# Patient Record
Sex: Female | Born: 2000 | Race: Black or African American | Hispanic: No | Marital: Single | Smoking: Never smoker
Health system: Southern US, Community
[De-identification: ages and names within clinical notes are randomized; demographics above are authoritative.]

## PROBLEM LIST (undated history)

## (undated) DIAGNOSIS — L309 Dermatitis, unspecified: Secondary | ICD-10-CM

## (undated) DIAGNOSIS — G7000 Myasthenia gravis without (acute) exacerbation: Secondary | ICD-10-CM

---
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
## 2020-01-06 ENCOUNTER — Ambulatory Visit (INDEPENDENT_AMBULATORY_CARE_PROVIDER_SITE_OTHER): Payer: 59 | Admitting: Pediatrics

## 2020-01-06 ENCOUNTER — Encounter (INDEPENDENT_AMBULATORY_CARE_PROVIDER_SITE_OTHER): Payer: Self-pay | Admitting: Pediatrics

## 2020-01-06 DIAGNOSIS — G7000 Myasthenia gravis without (acute) exacerbation: Secondary | ICD-10-CM

## 2020-01-06 DIAGNOSIS — Z72821 Inadequate sleep hygiene: Secondary | ICD-10-CM | POA: Diagnosis not present

## 2020-01-06 NOTE — Patient Instructions (Addendum)
I am glad that you are doing well.  At present I see no reason to make changes in your treatment.  I like the opportunity to see you when your semester is over in May.  Please get in touch with me sooner if you need any help.

## 2020-01-06 NOTE — Progress Notes (Signed)
This is a Pediatric Specialist E-Visit follow up consult provided via WebEx Autumn Hubbard and her mother Autumn Hubbard consented to an E-Visit consult today.  Location of patient: Autumn Hubbard is at home Location of provider: Sherron Flemings is at [facility] on [facility] Patient was referred by Autumn Body, MD   The following participants were involved in this E-Visit: Doristine Hubbard, Dr. 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 female who was evaluated January 06, 2020 for the first time since June 24, 2019.  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  She was seen by Dr. Dani Gobble at [facility] virtually September 21, 2019.  It was noted that she was diagnosed in 2018 though she had mild symptoms in 2016-2017.  She had ocular bulbar and mild generalized weakness.  She was seropositive acetylcholine receptor antibody she responded incompletely to oral Mestinon.  Chest CT scan was unremarkable but she had a thymectomy in July 2019.  This was associated with plasma exchange.  She was on a tapering dose of steroids and unfortunately developed intractable ptosis.  Azathioprine was added and she has been maintained on 5 mg of prednisone every other day without side effects and with a stable examination.  For the most part she does not sense fluctuations during the day although she remembers one time recently at Thanksgiving she had some difficulty swallowing  meat and liquid.  This lasted for about an hour.  She forgotten to take her Mestinon.  She has poor sleep hygiene.  She goes to bed between 3 and 4 in the morning and sleeps until 10 AM to 12 noon.  She takes her school classes at [employer].  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  She thinks that her weight is stable.  Her general health is good.  She has not contracted Covid.  The family had questions about whether or not she should receive the Covid vaccine.  Review of Systems: A complete review of systems was unremarkable except as noted above and below.  Past Medical History History reviewed. No pertinent past medical history. Hospitalizations: Yes.  , Head Injury: No., Nervous System Infections: No., Immunizations up to date: Yes.    Copied from prior chart Symptoms have been present since April or May 2018.  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 The blocking antibody was nondetectable.  Mestinon 30 mg 3 times daily, which significantly improved her strength. This was increased to 30 mg 4 times daily.  Single fiber EMG studies that provided electrodiagnostic evidence of a moderate defect of neuromuscular transmission, which is consistent with myasthenia gravis.  MRI scan of her chestshows a normal size thymus in the anterior mediastinum. She has had evaluation for thyroid antibodies, which were negative.  A decision was made to treat her with plasma exchange followed by thoracoscopic surgery  with resection of her thymus. This went well and she was placed on prednisone for quite some time, which was gradually tapered and discontinuedin December, 2019 or January, 2020.  [facility]  included binding antibody of greater than 80 nmol/L with normal being greater than 0.4. Blocking antibody 37%, positive greater than 30%, modulating antibody greater than 64%, positive greater than 25%. 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  Birth History 8lbs. 0oz. infant born at [redacted]weeks gestational age to a 19year old g 3p 1 0 1 57female. Gestation wasuncomplicated Mother receivedEpidural anesthesia Operative vaginal deliverywith vacuum extraction Nursery Course wascomplicated byNuchal cord and knot in umbilical cord Growth and Development wasrecalled asnormal  Behavior History none  Surgical History History reviewed. No pertinent surgical history.  Family History family history is not on file. Family history is negative for migraines, seizures, intellectual disabilities, blindness, deafness, birth defects, chromosomal disorder, or autism.  Social History Socioeconomic History  . Marital status: Single  . Years of education:  38  . Highest education level:  Quarry manager  Occupational History  . Not employed  Tobacco Use  . Smoking status: Never Smoker  . Smokeless tobacco: Never Used  Substance and Sexual Activity  . Alcohol use: Not on file  . Drug use: Not on file  . 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    She attended [employer].    She lives with both parents when school is out but on campus during the semester. She has one brother.    She enjoys video games, sleeping, and hanging with friends.       She attends [employer] majoring in bioengineering   No Known Allergies  Physical Exam There were no vitals taken for this visit.  General: alert, well developed, well nourished, in no acute distress, black hair, brown eyes, right handed Head: normocephalic, no dysmorphic features Neck: supple, full range of  motion Musculoskeletal: no skeletal deformities or apparent scoliosis Skin: no rashes or neurocutaneous lesions  Neurologic Exam  Mental Status: alert; oriented to person, place and year; knowledge is normal for age; language is normal Cranial Nerves: visual fields are full to double simultaneous stimuli; extraocular movements are full and conjugate; pupils are round reactive to light; funduscopic examination shows sharp disc margins with normal vessels; cannot fully bury her eyelashes, slight severe when she smiles; midline tongue and uvula; hearing appears normal bilaterally Motor: normal functional strength, tone and mass; good fine motor movements; no pronator drift Coordination: good finger-to-nose, rapid repetitive alternating movements and finger apposition Gait and Station: normal gait and station: patient is able to walk on heels, toes and tandem without difficulty; balance is adequate; Romberg exam is negative; Gower response is negative  Assessment 1.  Myasthenia gravis, G70.00. 2.  Poor sleep hygiene, Z72.821.  Discussion I am pleased that Ernest remains neurologically stable.  I explained to her and her mother that she has to have treatment both of her immune system with prednisone and azathioprine as well as the use of Mestinon to treat her disorder of neuromuscular transmission.  Omeprazole as needed to protect her stomach from steroid induced gastritis.  She also takes iron presumably for iron deficiency anemia.  I am concerned that she has such poor sleep hygiene and that she is not getting more exercise.  She feels that she is stable.  I know that she knows a difference when she is having an exacerbation.  Plan No change will be made in her current treatment.  I will refill her medications when requested.  She will return to see me at the end of her school year hopefully for an evaluation in office.  Greater than 50% of a 25-minute visit was spent in counseling and  coordination of care concerning her neuromuscular disorder and its treatment.   Medication List   Accurate as of January 06, 2020 11:59 PM. If you have any questions, ask your nurse or doctor.    azaTHIOprine 50 MG tablet Commonly known as: IMURAN 1+1/2 TABLETS (75MG  TOTAL) DAILY   ferrous sulfate 325 (65 FE) MG tablet Take 325 mg by mouth 2 (two) times daily with a meal.   omeprazole 20 MG capsule Commonly known as: PRILOSEC TAKE 1 CAPSULE BY MOUTH EVERY DAY   predniSONE 5 MG tablet every other day Commonly known as: DELTASONE Take as directed   pyridostigmine 60 MG tablet Commonly known as: MESTINON TAKE 3/4 TABLET 4 TIMES DAILY    The medication list was reviewed and reconciled. All changes or newly prescribed medications were explained.  A complete medication list was provided to the patient/caregiver.  MD

## 2020-02-27 ENCOUNTER — Other Ambulatory Visit (INDEPENDENT_AMBULATORY_CARE_PROVIDER_SITE_OTHER): Payer: Self-pay | Admitting: Pediatrics

## 2020-02-27 DIAGNOSIS — G7001 Myasthenia gravis with (acute) exacerbation: Secondary | ICD-10-CM

## 2020-05-16 ENCOUNTER — Other Ambulatory Visit (INDEPENDENT_AMBULATORY_CARE_PROVIDER_SITE_OTHER): Payer: Self-pay | Admitting: Pediatrics

## 2020-05-16 DIAGNOSIS — K2960 Other gastritis without bleeding: Secondary | ICD-10-CM

## 2020-05-17 ENCOUNTER — Encounter (INDEPENDENT_AMBULATORY_CARE_PROVIDER_SITE_OTHER): Payer: Self-pay | Admitting: Pediatrics

## 2020-05-17 ENCOUNTER — Other Ambulatory Visit: Payer: Self-pay

## 2020-05-17 ENCOUNTER — Ambulatory Visit (INDEPENDENT_AMBULATORY_CARE_PROVIDER_SITE_OTHER): Payer: 59 | Admitting: Pediatrics

## 2020-05-17 VITALS — BP 104/78 | HR 70 | Ht 65.35 in | Wt 121.6 lb

## 2020-05-17 DIAGNOSIS — Z72821 Inadequate sleep hygiene: Secondary | ICD-10-CM | POA: Diagnosis not present

## 2020-05-17 DIAGNOSIS — G7000 Myasthenia gravis without (acute) exacerbation: Secondary | ICD-10-CM | POA: Diagnosis not present

## 2020-05-17 DIAGNOSIS — T380X5A Adverse effect of glucocorticoids and synthetic analogues, initial encounter: Secondary | ICD-10-CM | POA: Diagnosis not present

## 2020-05-17 DIAGNOSIS — K2960 Other gastritis without bleeding: Secondary | ICD-10-CM

## 2020-05-17 MED ORDER — PYRIDOSTIGMINE BROMIDE 60 MG PO TABS: ORAL_TABLET | ORAL | 3 refills | Status: DC

## 2020-05-17 MED ORDER — AZATHIOPRINE 50 MG PO TABS: ORAL_TABLET | ORAL | 3 refills | Status: DC

## 2020-05-17 MED ORDER — PREDNISONE 5 MG PO TABS: ORAL_TABLET | ORAL | 3 refills | Status: DC

## 2020-05-17 MED ORDER — OMEPRAZOLE 20 MG PO CPDR: DELAYED_RELEASE_CAPSULE | ORAL | 3 refills | Status: DC

## 2020-05-17 NOTE — Progress Notes (Signed)
Patient: Autumn Hubbard MRN: [id number] Sex: female DOB: 11/12/2001  Provider: Ellison Carwin, MD Location of Care: [facility] Child Neurology  Note type: Routine return visit  History of Present Illness: Referral Source: Diamantina Monks, MD History from: patient, Autumn Hubbard chart and mom Chief Complaint: Myasthenia Gravis  Autumn Hubbard is a 19 y.o. female who returns May 2021 for the first time since January 06, 2020 for evaluation of generalized myasthenia gravis.  This is a condition that has vacillated in [place], sometimes being in exacerbation but for the most part being in good control.  The work-up associated with her condition is in the past medical history.  Since her last visit, a little over 4 months ago, Autumn Hubbard has been well.  She is not had any significant punctuation and strength.  Her mother mentioned today that they were able to work out together and caring to could keep up with her.  There have been times when mother said that Autumn Hubbard could not even keep up with her when they were walking.  She has not experienced any problems with diplopia, altered vision, eyelid ptosis, trouble swallowing, or axillary weakness.  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  She had a GPA of 3.5 which is very good.  Her general health has been good.  Her sleep hygiene continues to be problematic.  She goes to bed around 3 AM and sleeps until 1 PM.  This is not going to be possible when she returns to school in person this fall.  She has no plans for the summer.  She does not want to work at a job.  Both she and her mother had been vaccinated for Covid.  Review of Systems: A complete review of systems was assessed and was negative..  Past Medical History History reviewed. No pertinent past medical history. Hospitalizations: No., Head Injury: No., Nervous System Infections: No., Immunizations up to date: Yes.    Copied from prior chart Symptoms have  been present since April or May 2018.  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 The blocking antibody was nondetectable.  Mestinon 30 mg 3 times daily, which significantly improved her strength. This was increased to 30 mg 4 times daily.  Single fiber EMG studies that provided electrodiagnostic evidence of a moderate defect of neuromuscular transmission, which is consistent with myasthenia gravis.  MRI scan of her chestshows a normal size thymus in the anterior mediastinum. She has had evaluation for thyroid antibodies, which were negative.  A decision was made to treat her with plasma exchange followed by thoracoscopic surgery with resection of her thymus. This went well and she was placed on prednisone for quite some time, which was gradually tapered and discontinuedin December, 2019 or January, 2020.  [facility] included binding antibody of greater than 80 nmol/L with normal being greater than 0.4. Blocking antibody 37%, positive greater than 30%, modulating antibody greater than 64%, positive greater than 25%. 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  Birth History 8lbs. 0oz. infant born at [redacted]weeks gestational age to a 19year old g 3p 1 0 1 28female. Gestation wasuncomplicated Mother receivedEpidural anesthesia Operative vaginal deliverywith vacuum extraction Nursery Course wascomplicated byNuchal cord and knot in umbilical cord Growth and Development wasrecalled asnormal  Behavior History none  Surgical History History  reviewed. No pertinent surgical history.  Family History family history is not on file. Family history is negative for migraines, seizures, intellectual  disabilities, blindness, deafness, birth defects, chromosomal disorder, or autism.  Social History Socioeconomic History  . Marital status: Single  . Years of education:  84  . Highest education level:  High school graduate  Occupational History  . Not currently employed  Tobacco Use  . Smoking status: Never Smoker  . Smokeless tobacco: Never Used  Substance and Sexual Activity  . Alcohol use: Not on file  . Drug use: Not on file  . 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   She attended [employer].   She lives with both parents. She has one brother.   She enjoys video games, sleeping, and hanging with friends.      She will return to [employer] in the Fall for her sophomore year   No Known Allergies  Physical Exam BP 104/78   Pulse 70   Ht 5' 5.35" (1.66 m)   Wt 121 lb 9.6 oz (55.2 kg)   BMI 20.02 kg/m   General: alert, well developed, well nourished, in no acute distress, black hair, brown eyes, right handed Head: normocephalic, no dysmorphic features Ears, Nose and Throat: Otoscopic: tympanic membranes normal; pharynx: oropharynx is pink without exudates or tonsillar hypertrophy Neck: supple, full range of motion, no cranial or cervical bruits Respiratory: auscultation clear Cardiovascular: no murmurs, pulses are normal Musculoskeletal: no skeletal deformities or apparent scoliosis Skin: no rashes or neurocutaneous lesions  Neurologic Exam  Mental Status: alert; oriented to person, place and year; knowledge is normal for age; language is normal Cranial Nerves: visual fields are full to double simultaneous stimuli; extraocular movements are full and conjugate; pupils are round reactive to light; funduscopic examination shows sharp disc margins with normal vessels; symmetric facial strength; midline tongue and uvula; air conduction is greater than bone conduction bilaterally; no eyelid ptosis no facial weakness  no problems with extraocular movements, or asymmetric smile Motor: Normal strength, tone and mass; good fine motor movements; no pronator drift Sensory: intact responses to cold, vibration, proprioception and stereognosis Coordination: good finger-to-nose, rapid repetitive alternating movements and finger apposition Gait and Station: normal gait and station: patient is able to walk on heels, toes and tandem without difficulty; balance is adequate; Romberg exam is negative; Gower response is negative Reflexes: symmetric and diminished bilaterally; no clonus; bilateral flexor plantar responses  Assessment 1.  Myasthenia gravis, G70.00 2.  Poor sleep hygiene, Z72.821.  Discussion I am pleased that Autumn Hubbard is doing well.  There is no reason to change her medications.  At her request I switched her from the 20 mg prednisone to the 5 so she will not have to cut tablets.  Plan Prescriptions were issued for azathioprine, prednisone 5 mg tablets, and pyridostigmine.  I also added omeprazole.  She will return to see me in 6 months.  I will see her sooner if she has any exacerbation.  I informed her that I would be retiring September 27, 2021.  We will have to find a neuromuscular specialist either in town or at the tertiary care centers to provide long-term care.   Medication List   Accurate as of May 17, 2020 11:59 PM. If you have any questions, ask your nurse or doctor.    azaTHIOprine 50 MG tablet Commonly known as: IMURAN 1+1/2 TABLETS (75MG  TOTAL) DAILY What changed: Another  medication with the same name was removed. Continue taking this medication, and follow the directions you see here. Changed by: [name], MD   ferrous sulfate 325 (65 FE) MG tablet Take 325 mg by mouth 2 (two) times daily with a meal.   omeprazole 20 MG capsule Commonly known as: PRILOSEC TAKE 1 CAPSULE BY MOUTH EVERY DAY What changed:   how much to take  how to take this  when to take  this  additional instructions Changed by: [name], MD   predniSONE 5 MG tablet Commonly known as: DELTASONE Take 1 tablet every other day What changed:   medication strength  additional instructions Changed by: [name], MD   pyridostigmine 60 MG tablet Commonly known as: MESTINON TAKE ONE-HALF TABLET BY  MOUTH 4 TIMES DAILY    The medication list was reviewed and reconciled. All changes or newly prescribed medications were explained.  A complete medication list was provided to the patient/caregiver.  Jodi Geralds MD

## 2020-05-17 NOTE — Patient Instructions (Addendum)
I am very pleased to see you today and happy that you are doing so well both with your myasthenia and with school.  I would like to see you in 6 months.  I will be happy to see you sooner.  As I told you I will retire September 27, 2021 and we will need to find an adult neurologist to provide care.  I do not know if we will be able to do this in [place] or will have to turn to one of the tertiary care institutions in [place].  I see no reason to change your prednisone, pyridostigmine, or azathioprine.  I written for 90-day prescriptions for all 3.  Hopefully these can be converted to [facility] or if your pharmacy will fill them, I would not recommend that.

## 2020-08-07 ENCOUNTER — Other Ambulatory Visit (INDEPENDENT_AMBULATORY_CARE_PROVIDER_SITE_OTHER): Payer: Self-pay | Admitting: Pediatrics

## 2020-08-07 DIAGNOSIS — G7000 Myasthenia gravis without (acute) exacerbation: Secondary | ICD-10-CM

## 2020-08-14 ENCOUNTER — Telehealth (INDEPENDENT_AMBULATORY_CARE_PROVIDER_SITE_OTHER): Payer: Self-pay | Admitting: Pediatrics

## 2020-08-14 DIAGNOSIS — G7000 Myasthenia gravis without (acute) exacerbation: Secondary | ICD-10-CM

## 2020-08-14 MED ORDER — PYRIDOSTIGMINE BROMIDE 60 MG PO TABS: ORAL_TABLET | ORAL | 3 refills | Status: DC

## 2020-08-14 NOTE — Telephone Encounter (Signed)
I confirmed the  Azathioprine and prednisone but was unable to confirm the pyridostigmine.  Once mother confirms that I will give her orders.

## 2020-08-14 NOTE — Addendum Note (Signed)
Addended by: Deetta Perla on: 08/14/2020 05:09 PM   Modules accepted: Orders

## 2020-08-14 NOTE — Telephone Encounter (Signed)
Mom states that she is leaving the country tomorrow. If we are unable to reach her before she leaves she requests that we call patient's cell phone with instructions. Patient's cell phone number is [phone].

## 2020-08-14 NOTE — Telephone Encounter (Signed)
Patient says that she is taking 1 tablet 4 times daily but is only getting 100 tablets a month.  The math does not work out.  I am not certain why the pharmacy has continued to prescribe the prescription "early".  We will increase her to 1-1/4 tablets 4 times daily I told her to be on the look out for upset stomach and blurred vision.  If this happens we will have to increase her prednisone and drop her pyridostigmine back down.  Long-term are going to have to get her adult care probably at [facility] [place].

## 2020-08-14 NOTE — Telephone Encounter (Signed)
Mom called back to report that patient is taking 1 whole tablet of the pyridostigmine instead of 1/2 tablet.

## 2020-08-14 NOTE — Telephone Encounter (Signed)
Who's calling (name and relationship to patient) : Rhodia Albright mom   Best contact number: [phone]  Provider they see: Dr. Sharene Skeans  Reason for call: Mom called stating patient has started to slur her speech. Mom wondered if they should up the mestinon?  Call ID:      PRESCRIPTION REFILL ONLY  Name of prescription:  Pharmacy:

## 2020-09-25 ENCOUNTER — Ambulatory Visit: Payer: 59 | Attending: Internal Medicine

## 2020-09-25 DIAGNOSIS — Z23 Encounter for immunization: Secondary | ICD-10-CM

## 2020-09-25 NOTE — Progress Notes (Signed)
   Covid-19 Vaccination Clinic  Name:  Autumn Hubbard    MRN: [id number] DOB: Feb 26, 2001  09/25/2020  [name] was observed post Covid-19 immunization for 15 minutes without incident. She was provided with Vaccine Information Sheet and instruction to access the V-Safe system.   [name] was instructed to call 911 with any severe reactions post vaccine: Marland Kitchen Difficulty breathing  . Swelling of face and throat  . A fast heartbeat  . A bad rash all over body  . Dizziness and weakness

## 2020-11-19 ENCOUNTER — Ambulatory Visit (INDEPENDENT_AMBULATORY_CARE_PROVIDER_SITE_OTHER): Payer: 59 | Admitting: Pediatrics

## 2020-11-30 ENCOUNTER — Ambulatory Visit (INDEPENDENT_AMBULATORY_CARE_PROVIDER_SITE_OTHER): Payer: 59 | Admitting: Pediatrics

## 2020-12-19 ENCOUNTER — Other Ambulatory Visit: Payer: Self-pay

## 2020-12-19 ENCOUNTER — Ambulatory Visit (INDEPENDENT_AMBULATORY_CARE_PROVIDER_SITE_OTHER): Payer: 59 | Admitting: Pediatrics

## 2020-12-19 ENCOUNTER — Encounter (INDEPENDENT_AMBULATORY_CARE_PROVIDER_SITE_OTHER): Payer: Self-pay | Admitting: Pediatrics

## 2020-12-19 DIAGNOSIS — T380X5A Adverse effect of glucocorticoids and synthetic analogues, initial encounter: Secondary | ICD-10-CM

## 2020-12-19 DIAGNOSIS — K2960 Other gastritis without bleeding: Secondary | ICD-10-CM | POA: Diagnosis not present

## 2020-12-19 DIAGNOSIS — G7000 Myasthenia gravis without (acute) exacerbation: Secondary | ICD-10-CM

## 2020-12-19 LAB — CBC WITH DIFFERENTIAL/PLATELET
Basophils Absolute: 0 10*3/uL (ref 0.0–0.2)
Basos: 1 %
EOS (ABSOLUTE): 0 10*3/uL (ref 0.0–0.4)
Eos: 1 %
Hematocrit: 37.4 % (ref 34.0–46.6)
Hemoglobin: 12.3 g/dL (ref 11.1–15.9)
Immature Grans (Abs): 0 10*3/uL (ref 0.0–0.1)
Immature Granulocytes: 0 %
Lymphocytes Absolute: 1.2 10*3/uL (ref 0.7–3.1)
Lymphs: 31 %
MCH: 33.3 pg — ABNORMAL HIGH (ref 26.6–33.0)
MCHC: 32.9 g/dL (ref 31.5–35.7)
MCV: 101 fL — ABNORMAL HIGH (ref 79–97)
Monocytes Absolute: 0.4 10*3/uL (ref 0.1–0.9)
Monocytes: 9 %
Neutrophils Absolute: 2.3 10*3/uL (ref 1.4–7.0)
Neutrophils: 58 %
Platelets: 306 10*3/uL (ref 150–450)
RBC: 3.69 x10E6/uL — ABNORMAL LOW (ref 3.77–5.28)
RDW: 14 % (ref 11.7–15.4)
WBC: 3.9 10*3/uL (ref 3.4–10.8)

## 2020-12-19 MED ORDER — AZATHIOPRINE 50 MG PO TABS: ORAL_TABLET | ORAL | 2 refills | Status: DC

## 2020-12-19 MED ORDER — PREDNISONE 5 MG PO TABS: ORAL_TABLET | ORAL | 3 refills | Status: DC

## 2020-12-19 MED ORDER — OMEPRAZOLE 20 MG PO CPDR: DELAYED_RELEASE_CAPSULE | ORAL | 3 refills | Status: DC

## 2020-12-19 MED ORDER — PYRIDOSTIGMINE BROMIDE 60 MG PO TABS: ORAL_TABLET | ORAL | 3 refills | Status: DC

## 2020-12-19 NOTE — Progress Notes (Signed)
Patient: Autumn Hubbard MRN: [id number] Sex: female DOB: 2001-01-15  Provider: Ellison Carwin, MD Location of Care: [facility]  Note type: Routine return visit  History of Present Illness: Referral Source: Autumn Monks, MD History from: mother, patient and [facility] chart Chief Complaint: Myasthenia gravis  Autumn Hubbard is a 19 y.o. female who was evaluated December 19, 2020 for the first time since May 17, 2020.  She has generalized myasthenia gravis.  All this is been variable in its presentation, she has been extremely stable over the past 7 months.  Her mother says that every once in a while she thinks that carries his speech is slurred but she is not experiencing diplopia ptosis, or weakness in her limbs.  There is dates back to January 2021.  Is unclear to me why he has become so stable when she had a great deal of fluctuation considerably after her new therapy.  She is a sophomore at [employer] in [employer] [employer].  She is doing very well in school.  There are some nights that she only gets 4 to 6 hours of sleep because of school but somehow she is able to function it does not affect her strength or weakness and she is not falling asleep in class.  She lives in an apartment in [place].  Among her immune medications as azathioprine.  Is been a long time since she had a CBC.  I asked her to have that done within the next 1 to 2 weeks.  She has been immunized for Covid and did well.  I worry however that with the immunosuppressants that she might not have as robust response which would leave her vulnerable to becoming infected.  I told her that she needed to pay close attention to guidelines for the immunosuppressed which might be different from those who were not.  We also discussed transition of care.  I want her seen in the adult myasthenia gravis clinic at [facility].  I do not know who runs it after the retirement of Dr. Dimas Hubbard but I will find  out we will make that referral.  Review of Systems: A complete review of systems was remarkable for patient is here to be seen for myasthenia gravis. She reports that she has been doing well. She states that she has no concerns at this time., all other systems reviewed and negative.  Past Medical History History reviewed. No pertinent past medical history. Hospitalizations: No., Head Injury: No., Nervous System Infections: No., Immunizations up to date: Yes.    Copied from prior chart notes Symptoms have been present since April or May 2018.  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 The blocking antibody was nondetectable.  Mestinon 30 mg 3 times daily, which significantly improved her strength. This was increased to 30 mg 4 times daily.  Single fiber EMG studies that provided electrodiagnostic evidence of a moderate defect of neuromuscular transmission, which is consistent with myasthenia gravis.  MRI scan of her chestshows a normal size thymus in the anterior mediastinum. She has had evaluation for thyroid antibodies, which were negative.  A decision was made to treat her with plasma exchange followed by thoracoscopic surgery with resection of her thymus. This went well and she was placed on prednisone for quite some time, which was gradually tapered and discontinuedin December, 2019 or January, 2020.  [facility] included binding antibody of greater than 80 nmol/L with normal being greater than 0.4. Blocking antibody 37%, positive greater than 30%, modulating antibody greater than 64%, positive greater than 25%. 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  Birth History 8lbs. 0oz. infant born at  [redacted]weeks gestational age to a 19year old g 3p 1 0 1 59female. Gestation wasuncomplicated Mother receivedEpidural anesthesia Operative vaginal deliverywith vacuum extraction Nursery Course wascomplicated byNuchal cord and knot in umbilical cord Growth and Development wasrecalled asnormal  Behavior History none  Surgical History History reviewed.  Thoracostomy for removal of a thymus July 16, 2018.  This was preceded by systemic plasma exchange.  Family History family history is not on file. Family history is negative for migraines, seizures, intellectual disabilities, blindness, deafness, birth defects, chromosomal disorder, or autism.  Social History  Socioeconomic History  . Marital status: Single  . Years of education:  33  . Highest education level:  College sophomore  Occupational History  . Not currently employed  Tobacco Use  . Smoking status: Never Smoker  . Smokeless tobacco: Never Used  Substance and Sexual Activity  . Alcohol use: Not on file  . Drug use: Not on file  . 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    She attended [employer].    She lives with both parents. She has one brother.    She enjoys video games, sleeping, and hanging with friends.    She attends  [employer]. She is a Medical laboratory scientific officer.   No Known Allergies  Physical Exam BP 100/80   Pulse 64   Ht 5' 5.35" (1.66 m)   Wt 121 lb 6.4 oz (55.1 kg)   BMI 19.99 kg/m   General: alert, well developed, well nourished, in no acute distress, black hair, brown eyes, right handed Head: normocephalic, no dysmorphic features Ears, Nose and Throat: Otoscopic: tympanic membranes normal; pharynx: oropharynx is pink without exudates or tonsillar hypertrophy Neck: supple, full range of motion, no cranial or cervical bruits Respiratory: auscultation clear Cardiovascular: no murmurs, pulses are normal Musculoskeletal: no skeletal  deformities or apparent scoliosis Skin: no rashes or neurocutaneous lesions  Neurologic Exam  Mental Status: alert; oriented to person, place and year; knowledge is normal for age; language is normal Cranial Nerves: visual fields are full to double simultaneous stimuli; extraocular movements are full and conjugate; pupils are round reactive to light; funduscopic examination shows sharp disc margins with normal vessels; symmetric facial strength; midline tongue and uvula; air conduction is greater than bone conduction bilaterally Motor: Normal strength, tone and mass; good fine motor movements; no pronator drift Sensory: intact responses to cold, vibration, proprioception and stereognosis Coordination: good finger-to-nose, rapid repetitive alternating movements and finger apposition Gait and Station: normal gait and station: patient is able to walk on heels, toes and tandem without difficulty; balance is adequate; Romberg exam is negative; Gower response is negative Reflexes: symmetric and diminished bilaterally; no clonus; bilateral flexor plantar responses  Assessment 1.  Myasthenia gravis, G70.00 2.  Poor sleep hygiene, Z72.821  Discussion I am pleased that she is neurologically stable.  There is no reason to change her treatments at all.  Plan She will return to see me in 6 months' time.  I hope to have information on the adult myasthenia clinic so that we can affect a smooth transfer of care after my retirement  in September 27, 2021.  I wrote prescriptions for azathioprine omeprazole, prednisone, and pyridostigmine.  I also wrote an order for CBC with differential.   Medication List   Accurate as of December 19, 2020  8:21 AM. If you have any questions, ask your nurse or doctor.        azaTHIOprine 50 MG tablet Commonly known as: IMURAN TAKE 1 AND 1/2 TABLETS BY  MOUTH DAILY   ferrous sulfate 325 (65 FE) MG tablet Take 325 mg by mouth 2 (two) times daily with a meal.    omeprazole 20 MG capsule Commonly known as: PRILOSEC TAKE 1 CAPSULE BY MOUTH EVERY DAY   predniSONE 5 MG tablet Commonly known as: DELTASONE Take 1 tablet every other day   pyridostigmine 60 MG tablet Commonly known as: MESTINON Take 1-1/4 tablets by mouth 4 times daily      The medication list was reviewed and reconciled. All changes or newly prescribed medications were explained.  A complete medication list was provided to the patient/caregiver.  Deetta Perla MD

## 2020-12-19 NOTE — Patient Instructions (Signed)
It was a pleasure to see you today.  I am glad that things are so stable with your myasthenia.  As you know I will retire September 27, 2021.  We need to make certain that we have an exchange of information and I doubt an appointment for you at the myasthenia gravis clinic at [facility].  I will have to figure out what that contact information is to make the referral.  I want to see you again in 6 months.  That will be our last visit.  We need to check CBC with differential every 6 months to make certain that it stable.  I will report back to you whether or not there are any problems.  Please let me know if I can be of assistance between now and then.  Prescriptions were refilled for a year.

## 2020-12-20 ENCOUNTER — Telehealth (INDEPENDENT_AMBULATORY_CARE_PROVIDER_SITE_OTHER): Payer: Self-pay | Admitting: Pediatrics

## 2020-12-20 NOTE — Telephone Encounter (Signed)
I left a message that the blood counts are what would be expected with azathioprine, that they were completely normal but that we did not need to change the medication.  They were stable from previous visits to the current test.

## 2020-12-31 ENCOUNTER — Encounter (INDEPENDENT_AMBULATORY_CARE_PROVIDER_SITE_OTHER): Payer: Self-pay

## 2021-05-02 ENCOUNTER — Encounter (INDEPENDENT_AMBULATORY_CARE_PROVIDER_SITE_OTHER): Payer: Self-pay

## 2021-05-07 ENCOUNTER — Other Ambulatory Visit (INDEPENDENT_AMBULATORY_CARE_PROVIDER_SITE_OTHER): Payer: Self-pay | Admitting: Pediatrics

## 2021-05-07 DIAGNOSIS — G7000 Myasthenia gravis without (acute) exacerbation: Secondary | ICD-10-CM

## 2021-06-26 ENCOUNTER — Ambulatory Visit (INDEPENDENT_AMBULATORY_CARE_PROVIDER_SITE_OTHER): Payer: 59 | Admitting: Pediatrics

## 2021-06-28 ENCOUNTER — Encounter (INDEPENDENT_AMBULATORY_CARE_PROVIDER_SITE_OTHER): Payer: Self-pay | Admitting: Pediatrics

## 2021-06-28 ENCOUNTER — Other Ambulatory Visit: Payer: Self-pay

## 2021-06-28 ENCOUNTER — Ambulatory Visit (INDEPENDENT_AMBULATORY_CARE_PROVIDER_SITE_OTHER): Payer: 59 | Admitting: Pediatrics

## 2021-06-28 VITALS — BP 108/60 | HR 70 | Ht 65.5 in | Wt 122.8 lb

## 2021-06-28 DIAGNOSIS — Z79899 Other long term (current) drug therapy: Secondary | ICD-10-CM | POA: Diagnosis not present

## 2021-06-28 DIAGNOSIS — K2960 Other gastritis without bleeding: Secondary | ICD-10-CM | POA: Diagnosis not present

## 2021-06-28 DIAGNOSIS — T380X5A Adverse effect of glucocorticoids and synthetic analogues, initial encounter: Secondary | ICD-10-CM

## 2021-06-28 DIAGNOSIS — G7000 Myasthenia gravis without (acute) exacerbation: Secondary | ICD-10-CM

## 2021-06-28 MED ORDER — PYRIDOSTIGMINE BROMIDE 60 MG PO TABS: ORAL_TABLET | ORAL | 2 refills | Status: AC

## 2021-06-28 MED ORDER — PREDNISONE 5 MG PO TABS
ORAL_TABLET | ORAL | 3 refills | Status: DC
Start: 2021-06-28 — End: 2023-02-03

## 2021-06-28 MED ORDER — OMEPRAZOLE 20 MG PO CPDR: DELAYED_RELEASE_CAPSULE | ORAL | 3 refills | Status: DC

## 2021-06-28 MED ORDER — AZATHIOPRINE 50 MG PO TABS: ORAL_TABLET | ORAL | 2 refills | Status: DC

## 2021-06-28 NOTE — Progress Notes (Signed)
Patient: Autumn Hubbard MRN: [id number] Sex: female DOB: 20-Jan-2001  Provider: Ellison Carwin, MD Location of Care: [facility]  Note type: Routine return visit  History of Present Illness: Referral Source: Diamantina Monks, MD History from: patient, [facility] chart, and mom Chief Complaint: Myasthenia Gravis  Autumn Hubbard is a 20 y.o. female who was evaluated June 28, 2021 for the first time since December 19, 2020.  She has generalized myasthenia gravis.  She has been very stable since her last visit.  She has some generalized weakness in the morning, and on occasion her mother has noted slurred speech, but she has not experienced diplopia and has not had any significant exacerbations in well over a year.  Her medical history is noted below.  She takes prednisone, azathioprine, pyridostigmine, and omeprazole to protect her stomach.  Her general health is good.  She goes to bed around midnight to 1 AM and gets up around 10 AM.  She sleeps soundly.  She has not contracted COVID.  She has had 2 vaccines and a booster.  There has been some concern about the meningitis B vaccine.  I do not know enough about it to know if her primary physicians reluctance to give it is because of some adverse effect or whether it is just that as an immunosuppressed person, it is not likely to protect her.  Is been a long time since she has had a CBC.  I asked her to get 1 today because of the effects of azathioprine and prednisone.  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  She has a 3.5 grade point average and is having to work hard but is doing well.  In January I went to the website and fill that out so that she could be seen at [facility].  The family has not heard from [facility] and I will take additional steps to make certain that she is transferred to the adult myasthenia gravis clinic well before my retirement of September 27, 2021.  Review of Systems: A  complete review of systems was assessed and was negative.  Past Medical History History reviewed. No pertinent past medical history. Hospitalizations: No., Head Injury: No., Nervous System Infections: No., Immunizations up to date: Yes.    Copied from prior chart notes Symptoms have been present since April or May 2018.    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  The blocking antibody was nondetectable.   Mestinon 30 mg 3 times daily, which significantly improved her strength.  This was increased to 30 mg 4 times daily.   Single fiber EMG studies that provided electrodiagnostic evidence of a moderate defect of neuromuscular transmission, which is consistent with myasthenia gravis.   MRI scan of her chest shows a normal size thymus in the anterior mediastinum.  She has had evaluation for thyroid antibodies, which were negative.   A decision was made to treat her with plasma exchange followed by thoracoscopic surgery with resection of her thymus.  This went well and she was placed on prednisone for quite some time, which was gradually tapered and discontinued in December, 2019 or January, 2020.  She suffered a relapse which led [place] to restart prednisone.  She has had no side effects from it.   Laboratory studies performed at [facility] for acetylcholine receptor antibodies included binding antibody of greater  than 80 nmol/L with normal being greater than 0.4.  Blocking antibody 37%, positive greater than 30%, modulating antibody greater than 64%, positive greater than 25%.  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   Birth History 8 lbs. 0 oz. infant born at [redacted] weeks gestational age to a 20 year old g 3 p 1 0 1 1 female. Gestation was uncomplicated Mother received Epidural anesthesia   Operative vaginal delivery with vacuum extraction Nursery Course was complicated by Nuchal cord and knot in umbilical cord Growth and Development was recalled as  normal   Behavior History none   Surgical History History reviewed.  Thoracostomy for removal of a thymus July 16, 2018.  This was preceded by systemic plasma exchange.  Family History family history is not on file. Family history is negative for migraines, seizures, intellectual disabilities, blindness, deafness, birth defects, chromosomal disorder, or autism.  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   She attended [employer].   She lives with both parents. She has one brother.   She enjoys video games, sleeping, and hanging with friends.      She attends [employer]. She is a rising junior   No Known Allergies  Physical Exam BP 108/60   Pulse 70   Ht 5' 5.5" (1.664 m)   Wt 122 lb 12.8 oz (55.7 kg)   BMI 20.12 kg/m   General: alert, well developed, well nourished, in no acute distress, black hair, brown eyes, right handed Head: normocephalic, no dysmorphic features Ears, Nose and Throat: Otoscopic: tympanic membranes normal; pharynx: oropharynx is pink without exudates or tonsillar hypertrophy Neck: supple, full range of motion, no cranial or cervical bruits Respiratory: auscultation clear Cardiovascular: no murmurs, pulses are normal Musculoskeletal: no skeletal deformities or apparent scoliosis Skin: no rashes or neurocutaneous lesions  Neurologic Exam  Mental Status: alert; oriented to person, place and year; knowledge is  normal for age; language is normal Cranial Nerves: visual fields are full to double simultaneous stimuli; extraocular movements are full and conjugate; pupils are round reactive to light; funduscopic examination shows sharp disc margins with normal vessels; symmetric facial strength; midline tongue and uvula; air conduction is greater than bone conduction bilaterally Motor: normal strength, tone and mass; good fine motor movements; no pronator drift Sensory: intact responses to cold, vibration, proprioception and stereognosis Coordination: good finger-to-nose, rapid repetitive alternating movements and finger apposition Gait and Station: normal gait and station: patient is able to walk on heels, toes and tandem without difficulty; balance is adequate; Romberg exam is negative; Gower response is negative Reflexes: symmetric and diminished bilaterally; no clonus; bilateral flexor plantar responses   Assessment 1.  Myasthenia gravis, G70.00.  Discussion I am pleased that Autumn Hubbard is doing well.  I am concerned that we have not heard from [facility].  Given that we are heading into a long weekend I will address this after that time and in contact with the relevant schedulers for the adult myasthenia gravis clinic and find out what we  have to do.  I want make certain that she has an appointment before I retire so that it does not fall in the family.  I also make sure that she has an appointment before her medications run out 6 months from now.  Plan Refills were made for prednisone, azathioprine, pyridostigmine, and omeprazole.  I also wrote an order for CBC with differential.  She needs to be seen before late December 2022 at the adult myasthenia gravis clinic at [facility].  I told her that our office would help in any way we could between September 30 when I retire and that time.  I told her that if she had a significant exacerbation that she should go to the emergency department where she  would be seen by the adult neuro hospitalists.  Greater than 50% of a 30-minute visit spent in counseling and coordination of care concerning her myasthenia, discussing her treatment, and discussing issues related to transition of care.   Medication List    Accurate as of June 28, 2021  8:22 AM. If you have any questions, ask your nurse or doctor.     azaTHIOprine 50 MG tablet Commonly known as: IMURAN TAKE 1 AND 1/2 TABLETS BY  MOUTH DAILY   ferrous sulfate 325 (65 FE) MG tablet Take 325 mg by mouth 2 (two) times daily with a meal.   omeprazole 20 MG capsule Commonly known as: PRILOSEC TAKE 1 CAPSULE BY MOUTH EVERY DAY   predniSONE 5 MG tablet Commonly known as: DELTASONE Take 1 tablet every other day   pyridostigmine 60 MG tablet Commonly known as: MESTINON TAKE ONE-HALF TABLET BY  MOUTH 4 TIMES DAILY     The medication list was reviewed and reconciled. All changes or newly prescribed medications were explained.  A complete medication list was provided to the patient/caregiver.  Deetta Perla MD

## 2021-06-28 NOTE — Patient Instructions (Addendum)
At [facility], we are committed to providing exceptional care. You will receive a patient satisfaction survey through text or email regarding your visit today. Your opinion is important to me. Comments are appreciated.   It's been a real pleasure to care for you over the past few years.  I will work with you to try to get an appointment at [facility].  I have filled out the website back in January I do not recall exactly what I did.  I will try to speak with the staff and is responsible for scheduling and find out exactly what has to happen.  I will write an order requesting transfer of care and will be happy to send any notes that they want.  Please sign up for My Chart so that she have a way to communicate with me.  I will be available until September 30 which is the day I retire.  I wish you good health and know that you do well and what ever you choose to do.

## 2021-06-29 LAB — CBC WITH DIFFERENTIAL/PLATELET
Basophils Absolute: 0 10*3/uL (ref 0.0–0.2)
Basos: 1 %
EOS (ABSOLUTE): 0 10*3/uL (ref 0.0–0.4)
Eos: 0 %
Hematocrit: 38.1 % (ref 34.0–46.6)
Hemoglobin: 12.9 g/dL (ref 11.1–15.9)
Immature Grans (Abs): 0 10*3/uL (ref 0.0–0.1)
Immature Granulocytes: 0 %
Lymphocytes Absolute: 0.8 10*3/uL (ref 0.7–3.1)
Lymphs: 24 %
MCH: 33.8 pg — ABNORMAL HIGH (ref 26.6–33.0)
MCHC: 33.9 g/dL (ref 31.5–35.7)
MCV: 100 fL — ABNORMAL HIGH (ref 79–97)
Monocytes Absolute: 0.3 10*3/uL (ref 0.1–0.9)
Monocytes: 10 %
Neutrophils Absolute: 2.3 10*3/uL (ref 1.4–7.0)
Neutrophils: 65 %
Platelets: 317 10*3/uL (ref 150–450)
RBC: 3.82 x10E6/uL (ref 3.77–5.28)
RDW: 12.8 % (ref 11.7–15.4)
WBC: 3.5 10*3/uL (ref 3.4–10.8)

## 2021-07-02 ENCOUNTER — Encounter (INDEPENDENT_AMBULATORY_CARE_PROVIDER_SITE_OTHER): Payer: Self-pay

## 2021-09-11 ENCOUNTER — Other Ambulatory Visit (INDEPENDENT_AMBULATORY_CARE_PROVIDER_SITE_OTHER): Payer: Self-pay | Admitting: Pediatrics

## 2021-09-11 DIAGNOSIS — G7000 Myasthenia gravis without (acute) exacerbation: Secondary | ICD-10-CM

## 2022-04-27 ENCOUNTER — Ambulatory Visit
Admission: EM | Admit: 2022-04-27 | Discharge: 2022-04-27 | Disposition: A | Discharge status: Home / Self Care | Payer: 59 | Attending: Urgent Care | Admitting: Urgent Care

## 2022-04-27 ENCOUNTER — Encounter: Payer: Self-pay | Admitting: Emergency Medicine

## 2022-04-27 DIAGNOSIS — J309 Allergic rhinitis, unspecified: Secondary | ICD-10-CM

## 2022-04-27 DIAGNOSIS — G7000 Myasthenia gravis without (acute) exacerbation: Secondary | ICD-10-CM

## 2022-04-27 DIAGNOSIS — J019 Acute sinusitis, unspecified: Secondary | ICD-10-CM

## 2022-04-27 MED ORDER — AMOXICILLIN 875 MG PO TABS: 875.0000 mg | ORAL_TABLET | Freq: Two times a day (BID) | ORAL | 0 refills | Status: DC

## 2022-04-27 NOTE — ED Triage Notes (Signed)
Patient c/o nasal drainage, congestion, mucous in eyes x 3-4 days.  Patient has taken Allegra-D and Flonase for sx's. ?

## 2022-04-27 NOTE — ED Provider Notes (Signed)
?[facility] ? ? ?MRN: [id number] DOB: 21-Nov-2001 ? ?Subjective:  ? ?Autumn Hubbard is a 21 y.o. female presenting for 4 day history of acute onset facial pain, sinus pressure, sinus congestion, coughing. Has been using Allegra-D, Flonase without relief. Has Myasthenia Gravis, is on immunosuppression for this. No chest pain, shob, wheezing. No smoking, history of asthma.  ? ?No current facility-administered medications for this encounter. ? ?Current Outpatient Medications:  ?  azaTHIOprine (IMURAN) 50 MG tablet, TAKE 1 AND 1/2 TABLETS BY  MOUTH DAILY, Disp: 180 tablet, Rfl: 2 ?  ferrous sulfate 325 (65 FE) MG tablet, Take 325 mg by mouth 2 (two) times daily with a meal., Disp: , Rfl:  ?  predniSONE (DELTASONE) 5 MG tablet, Take 1 tablet every other day, Disp: 45 tablet, Rfl: 3 ?  pyridostigmine (MESTINON) 60 MG tablet, TAKE ONE-HALF TABLET BY  MOUTH 4 TIMES DAILY, Disp: 300 tablet, Rfl: 2 ?  omeprazole (PRILOSEC) 20 MG capsule, TAKE 1 CAPSULE BY MOUTH EVERY DAY, Disp: 90 capsule, Rfl: 3  ? ?No Known Allergies ? ?History reviewed. No pertinent past medical history.  ? ?History reviewed. No pertinent surgical history. ? ?History reviewed. No pertinent family history. ? ?Social History  ? ?Tobacco Use  ? Smoking status: Never  ? Smokeless tobacco: Never  ?Vaping Use  ? Vaping Use: Never used  ?Substance Use Topics  ? Alcohol use: Yes  ?  Comment: socially  ? Drug use: Never  ? ? ?ROS ? ? ?Objective:  ? ?Vitals: ?BP 113/76 (BP Location: Right Arm)   Pulse 88   Temp 98.3 ?F (36.8 ?C) (Oral)   Resp 18   Ht 5\' 5"  (1.651 m)   Wt 120 lb (54.4 kg)   SpO2 95%   BMI 19.97 kg/m?  ? ?Physical Exam ?Constitutional:   ?   General: She is not in acute distress. ?   Appearance: Normal appearance. She is well-developed and normal weight. She is not ill-appearing, toxic-appearing or diaphoretic.  ?HENT:  ?   Head: Normocephalic and atraumatic.  ?   Right Ear: Tympanic membrane, ear canal and external ear normal. No  drainage or tenderness. No middle ear effusion. There is no impacted cerumen. Tympanic membrane is not erythematous.  ?   Left Ear: Tympanic membrane, ear canal and external ear normal. No drainage or tenderness.  No middle ear effusion. There is no impacted cerumen. Tympanic membrane is not erythematous.  ?   Nose: Congestion and rhinorrhea present.  ?   Mouth/Throat:  ?   Mouth: Mucous membranes are moist. No oral lesions.  ?   Pharynx: No pharyngeal swelling, oropharyngeal exudate, posterior oropharyngeal erythema or uvula swelling.  ?   Tonsils: No tonsillar exudate or tonsillar abscesses.  ?   Comments: Thick streaks of post-nasal drainage overlying pharynx.  ?Eyes:  ?   General: No scleral icterus.    ?   Right eye: No discharge.     ?   Left eye: No discharge.  ?   Extraocular Movements: Extraocular movements intact.  ?   Right eye: Normal extraocular motion.  ?   Left eye: Normal extraocular motion.  ?   Conjunctiva/sclera: Conjunctivae normal.  ?Cardiovascular:  ?   Rate and Rhythm: Normal rate.  ?   Heart sounds: No murmur heard. ?  No friction rub. No gallop.  ?Pulmonary:  ?   Effort: Pulmonary effort is normal. No respiratory distress.  ?   Breath sounds: No stridor. No wheezing, rhonchi  or rales.  ?Chest:  ?   Chest wall: No tenderness.  ?Musculoskeletal:  ?   Cervical back: Normal range of motion and neck supple.  ?Lymphadenopathy:  ?   Cervical: No cervical adenopathy.  ?Skin: ?   General: Skin is warm and dry.  ?Neurological:  ?   General: No focal deficit present.  ?   Mental Status: She is alert and oriented to person, place, and time.  ?Psychiatric:     ?   Mood and Affect: Mood normal.     ?   Behavior: Behavior normal.  ? ? ?Assessment and Plan :  ? ?PDMP not reviewed this encounter. ? ?1. Acute non-recurrent sinusitis, unspecified location   ?2. Allergic rhinitis, unspecified seasonality, unspecified trigger   ?3. Myasthenia gravis (HCC)   ? ?Patient is on immunosuppression and therefore  susceptible to bacterial infections.  She is using her allergy medications consistently and therefore will supplement her treatment by using amoxicillin for a bacterial sinusitis.  Recommended maintaining her allergy medications except for Flonase. Deferred imaging given clear cardiopulmonary exam, hemodynamically stable vital signs.  I did advise patient that if she has no relief from her symptoms that we may end up needing to use a steroid course to help address her symptoms for sinusitis.  She would have to return to clinic to be reevaluated.  Consider chest x-ray. Counseled patient on potential for adverse effects with medications prescribed/recommended today, ER and return-to-clinic precautions discussed, patient verbalized understanding. ? ?  ?Wallis Bamberg, PA-C ?04/27/22 1514 ? ?

## 2022-08-25 ENCOUNTER — Encounter (HOSPITAL_COMMUNITY): Payer: Self-pay

## 2022-08-25 ENCOUNTER — Ambulatory Visit (HOSPITAL_COMMUNITY)
Admission: EM | Admit: 2022-08-25 | Discharge: 2022-08-25 | Disposition: A | Discharge status: Home / Self Care | Payer: 59

## 2022-08-25 DIAGNOSIS — L03011 Cellulitis of right finger: Secondary | ICD-10-CM | POA: Diagnosis not present

## 2022-08-25 MED ORDER — IBUPROFEN 800 MG PO TABS: 800.0000 mg | ORAL_TABLET | Freq: Once | ORAL | Status: DC

## 2022-08-25 MED ORDER — IBUPROFEN 800 MG PO TABS
ORAL_TABLET | ORAL | Status: AC
  Filled 2022-08-25: qty 1

## 2022-08-25 MED ORDER — IBUPROFEN 600 MG PO TABS: 600.0000 mg | ORAL_TABLET | Freq: Four times a day (QID) | ORAL | 0 refills | Status: DC | PRN

## 2022-08-25 MED ORDER — CEPHALEXIN 500 MG PO CAPS: 500.0000 mg | ORAL_CAPSULE | Freq: Two times a day (BID) | ORAL | 0 refills | Status: AC

## 2022-08-25 NOTE — ED Triage Notes (Signed)
Pt thumb finger on right hand has possible infection due to a hang nail .Thumb finger is red swollen and painful x2days

## 2022-08-25 NOTE — Discharge Instructions (Signed)
Take Keflex twice daily for the next 5 days. Soak your right thumb in salt water 3 times daily. Take ibuprofen 600 mg every 6 hours as needed for pain and swelling to the right thumb. You may have more ibuprofen in 8 hours since you were given some the clinic tonight. Take ibuprofen with food to avoid stomach upset.   Keep your wound clean, dry, and covered.   If you develop any new or worsening symptoms or do not improve in the next 2 to 3 days, please return.  If your symptoms are severe, please go to the emergency room.  Follow-up with your primary care provider for further evaluation and management of your symptoms as well as ongoing wellness visits.  I hope you feel better!

## 2022-08-25 NOTE — ED Provider Notes (Signed)
[facility]    CSN: [id number] Arrival date & time: 08/25/22  1621      History   Chief Complaint Chief Complaint  Patient presents with   Hand Pain    HPI Autumn Hubbard is a 21 y.o. female.   Patient presents urgent care for evaluation of wound to the right thumb after pulling a hangnail a couple of days ago.  Patient states that the skin surrounding the lateral aspect of the right thumbnail is swollen, warm, and red.  She has not attempted use of any over-the-counter medications prior to arrival urgent care.  Denies recent visits to the nail salon for manicure or use of dirty equipment to trim nails.  Pain is currently an 5 on a scale of 0-10 without palpation and this increases to an 8 on a scale of 0-10 with palpation of the infected area.  Nail is intact.     History reviewed. No pertinent past medical history.  Patient Active Problem List   Diagnosis Date Noted   Myasthenia gravis with acute exacerbation (HCC) 04/27/2019   Poor sleep hygiene 04/27/2019   Steroid-induced gastritis 04/27/2019   Myasthenia gravis (HCC) 12/14/2017    History reviewed. No pertinent surgical history.  OB History   No obstetric history on file.      Home Medications    Prior to Admission medications   Medication Sig Start Date End Date Taking? Authorizing Provider  cephALEXin (KEFLEX) 500 MG capsule Take 1 capsule (500 mg total) by mouth 2 (two) times daily for 5 days. 08/25/22 08/30/22 Yes StanhopeDonavan Burnet, FNP  ferrous sulfate 325 (65 FE) MG tablet Take by mouth. 04/04/19  Yes [provider]  ibuprofen (ADVIL) 600 MG tablet Take 1 tablet (600 mg total) by mouth every 6 (six) hours as needed. 08/25/22  Yes Carlisle Beers, FNP  predniSONE (DELTASONE) 5 MG tablet Take by mouth. 09/04/21  Yes [provider]  pyridostigmine (MESTINON) 60 MG tablet Take by mouth. 07/23/22 07/23/23 Yes [provider]  amoxicillin (AMOXIL) 875 MG tablet Take  1 tablet (875 mg total) by mouth 2 (two) times daily. 04/27/22   Wallis Bamberg, PA-C  azaTHIOprine (IMURAN) 50 MG tablet TAKE 1 AND 1/2 TABLETS BY  MOUTH DAILY 06/28/21   Deetta Perla, MD  ferrous sulfate 325 (65 FE) MG tablet Take 325 mg by mouth 2 (two) times daily with a meal. 04/04/19   [provider]  omeprazole (PRILOSEC) 20 MG capsule TAKE 1 CAPSULE BY MOUTH EVERY DAY 06/28/21   Deetta Perla, MD  predniSONE (DELTASONE) 5 MG tablet Take 1 tablet every other day 06/28/21   Deetta Perla, MD  pyridostigmine (MESTINON) 60 MG tablet TAKE ONE-HALF TABLET BY  MOUTH 4 TIMES DAILY 06/28/21   Deetta Perla, MD    Family History History reviewed. No pertinent family history.  Social History Social History   Tobacco Use   Smoking status: Never   Smokeless tobacco: Never  Vaping Use   Vaping Use: Never used  Substance Use Topics   Alcohol use: Yes    Comment: socially   Drug use: Never     Allergies   Patient has no known allergies.   Review of Systems Review of Systems Per HPI  Physical Exam Triage Vital Signs ED Triage Vitals  Enc Vitals Group     BP 08/25/22 1701 121/81     Pulse Rate 08/25/22 1701 77     Resp 08/25/22  1701 12     Temp 08/25/22 1701 98.6 F (37 C)     Temp Source 08/25/22 1701 Oral     SpO2 08/25/22 1701 98 %     Weight 08/25/22 1659 120 lb (54.4 kg)     Height 08/25/22 1659 5\' 5"  (1.651 m)     Head Circumference --      Peak Flow --      Pain Score 08/25/22 1659 6     Pain Loc --      Pain Edu? --      Excl. in GC? --    No data found.  Updated Vital Signs BP 121/81 (BP Location: Left Arm)   Pulse 77   Temp 98.6 F (37 C) (Oral)   Resp 12   Ht 5\' 5"  (1.651 m)   Wt 120 lb (54.4 kg)   LMP 08/18/2022   SpO2 98%   BMI 19.97 kg/m   Visual Acuity Right Eye Distance:   Left Eye Distance:   Bilateral Distance:    Right Eye Near:   Left Eye Near:    Bilateral Near:     Physical Exam Vitals and nursing note  reviewed.  Constitutional:      Appearance: Normal appearance. She is not ill-appearing or toxic-appearing.     Comments: Very pleasant patient sitting on exam in position of comfort table in no acute distress.   HENT:     Head: Normocephalic and atraumatic.     Right Ear: Hearing and external ear normal.     Left Ear: Hearing and external ear normal.     Nose: Nose normal.     Mouth/Throat:     Lips: Pink.     Mouth: Mucous membranes are moist.  Eyes:     General: Lids are normal. Vision grossly intact. Gaze aligned appropriately.     Extraocular Movements: Extraocular movements intact.     Conjunctiva/sclera: Conjunctivae normal.  Pulmonary:     Effort: Pulmonary effort is normal.  Abdominal:     Palpations: Abdomen is soft.  Musculoskeletal:     Cervical back: Neck supple.  Skin:    General: Skin is warm and dry.     Capillary Refill: Capillary refill takes less than 2 seconds.     Findings: No rash.     Comments: Paronychia to the right thumb present that is warm, erythematous, and swollen.  There is an area of pus and fluctuation present.  Normal range of motion of the right thumb.  Capillary refill is less than 3.  +2 radial pulse present to the right wrist.  Neurological:     General: No focal deficit present.     Mental Status: She is alert and oriented to person, place, and time. Mental status is at baseline.     Cranial Nerves: No dysarthria or facial asymmetry.     Gait: Gait is intact.  Psychiatric:        Mood and Affect: Mood normal.        Speech: Speech normal.        Behavior: Behavior normal.        Thought Content: Thought content normal.        Judgment: Judgment normal.       UC Treatments / Results  Labs (all labs ordered are listed, but only abnormal results are displayed) Labs Reviewed - No data to display  EKG   Radiology No results found.  Procedures Incision and Drainage  Date/Time: 08/25/2022  6:15 PM  Performed by: Carlisle Beers, FNP Authorized by: Carlisle Beers, FNP   Consent:    Consent obtained:  Verbal   Consent given by:  Patient   Risks discussed:  Bleeding, incomplete drainage, pain, infection and damage to other organs   Alternatives discussed:  No treatment and delayed treatment Universal protocol:    Patient identity confirmed:  Verbally with patient Location:    Type:  Abscess (Paronychia)   Size:  0.5 cm   Location:  Upper extremity   Upper extremity location:  Finger   Finger location:  R thumb Pre-procedure details:    Skin preparation:  Chlorhexidine with alcohol Sedation:    Sedation type:  None Anesthesia:    Anesthesia method:  Topical application   Topical anesthesia: Pain EEze freezing numbing spray. Procedure details:    Incision types:  Stab incision (18 guauge needle)   Drainage:  Purulent and bloody   Drainage amount:  Scant   Wound treatment:  Wound left open   Packing materials:  None Post-procedure details:    Procedure completion:  Tolerated well, no immediate complications  (including critical care time)  Medications Ordered in UC Medications  ibuprofen (ADVIL) tablet 800 mg (has no administration in time range)    Initial Impression / Assessment and Plan / UC Course  I have reviewed the triage vital signs and the nursing notes.  Pertinent labs & imaging results that were available during my care of the patient were reviewed by me and considered in my medical decision making (see chart for details).   1.  Paronychia of the finger of the right hand Paronychia drained in clinic with 18-gauge needle stab incision using pain ease spray.  See procedure note above for incision and drainage procedure details.  Wound cleansed and dressed in clinic with Band-Aid.  Patient to perform salt water soaks 3 times daily for the next few days to reduce pain and swelling to the right thumb. Keflex antibiotic prescribed to be taken 2 times daily for the next 5 days to  cover for infected material to abscess.  Ibuprofen 600 mg every 6 hours may be used as needed for pain and inflammation.  Patient given 800 mg of ibuprofen in clinic for pain and inflammation.  Advised patient to take this medicine with food to avoid stomach upset.   Discussed physical exam and available lab work findings in clinic with patient.  Counseled patient regarding appropriate use of medications and potential side effects for all medications recommended or prescribed today. Discussed red flag signs and symptoms of worsening condition,when to call the PCP office, return to urgent care, and when to seek higher level of care in the emergency department. Patient verbalizes understanding and agreement with plan. All questions answered. Patient discharged in stable condition.  Final Clinical Impressions(s) / UC Diagnoses   Final diagnoses:  Paronychia of finger of right hand     Discharge Instructions      Take Keflex twice daily for the next 5 days. Soak your right thumb in salt water 3 times daily. Take ibuprofen 600 mg every 6 hours as needed for pain and swelling to the right thumb. You may have more ibuprofen in 8 hours since you were given some the clinic tonight. Take ibuprofen with food to avoid stomach upset.   Keep your wound clean, dry, and covered.   If you develop any new or worsening symptoms or do not improve in the next 2 to 3  days, please return.  If your symptoms are severe, please go to the emergency room.  Follow-up with your primary care provider for further evaluation and management of your symptoms as well as ongoing wellness visits.  I hope you feel better!     ED Prescriptions     Medication Sig Dispense Auth. Provider   cephALEXin (KEFLEX) 500 MG capsule Take 1 capsule (500 mg total) by mouth 2 (two) times daily for 5 days. 10 capsule Carlisle Beers, FNP   ibuprofen (ADVIL) 600 MG tablet Take 1 tablet (600 mg total) by mouth every 6 (six) hours as  needed. 30 tablet Carlisle Beers, FNP      PDMP not reviewed this encounter.   Carlisle Beers, [place] 08/25/22 [phone]

## 2022-12-10 ENCOUNTER — Ambulatory Visit
Admission: RE | Admit: 2022-12-10 | Discharge: 2022-12-10 | Disposition: A | Discharge status: Home / Self Care | Payer: 59 | Source: Ambulatory Visit | Attending: Physician Assistant | Admitting: Physician Assistant

## 2022-12-10 VITALS — BP 97/67 | HR 69 | Temp 98.3°F | Resp 16

## 2022-12-10 DIAGNOSIS — L309 Dermatitis, unspecified: Secondary | ICD-10-CM | POA: Diagnosis not present

## 2022-12-10 HISTORY — DX: Myasthenia gravis without (acute) exacerbation: G70.00

## 2022-12-10 MED ORDER — TRIAMCINOLONE ACETONIDE 0.1 % EX CREA: 1.0000 | TOPICAL_CREAM | Freq: Two times a day (BID) | CUTANEOUS | 0 refills | Status: DC

## 2022-12-10 NOTE — ED Provider Notes (Signed)
[facility]    CSN: [id number] Arrival date & time: 12/10/22  1050      History   Chief Complaint Chief Complaint  Patient presents with   Eczema    HPI Autumn Hubbard is a 21 y.o. female.   Patient here today for evaluation of itchy dry skin patches to her bilateral arms, abdomen, and occasionally on her face.  She states that typically the lesions to her face resolve quickly but she has continued to have some patches to her arms and abdomen.  This is seem to recur over the last several months.  She has tried using an eczema lotion without resolution.  She denies any shortness of breath or trouble swallowing.  The history is provided by the patient.    Past Medical History:  Diagnosis Date   Myasthenia gravis [facility])     Patient Active Problem List   Diagnosis Date Noted   Myasthenia gravis with acute exacerbation (HCC) 04/27/2019   Poor sleep hygiene 04/27/2019   Steroid-induced gastritis 04/27/2019   Myasthenia gravis (HCC) 12/14/2017    History reviewed. No pertinent surgical history.  OB History   No obstetric history on file.      Home Medications    Prior to Admission medications   Medication Sig Start Date End Date Taking? Authorizing Provider  triamcinolone cream (KENALOG) 0.1 % Apply 1 Application topically 2 (two) times daily. 12/10/22  Yes Tomi Bamberger, PA-C  amoxicillin (AMOXIL) 875 MG tablet Take 1 tablet (875 mg total) by mouth 2 (two) times daily. 04/27/22   Wallis Bamberg, PA-C  azaTHIOprine (IMURAN) 50 MG tablet TAKE 1 AND 1/2 TABLETS BY  MOUTH DAILY 06/28/21   Deetta Perla, MD  ferrous sulfate 325 (65 FE) MG tablet Take 325 mg by mouth 2 (two) times daily with a meal. 04/04/19   [provider]  ferrous sulfate 325 (65 FE) MG tablet Take by mouth. 04/04/19   [provider]  ibuprofen (ADVIL) 600 MG tablet Take 1 tablet (600 mg total) by mouth every 6 (six) hours as needed. 08/25/22   Carlisle Beers, FNP   omeprazole (PRILOSEC) 20 MG capsule TAKE 1 CAPSULE BY MOUTH EVERY DAY 06/28/21   Deetta Perla, MD  predniSONE (DELTASONE) 5 MG tablet Take 1 tablet every other day 06/28/21   Deetta Perla, MD  predniSONE (DELTASONE) 5 MG tablet Take by mouth. 09/04/21   [provider]  pyridostigmine (MESTINON) 60 MG tablet TAKE ONE-HALF TABLET BY  MOUTH 4 TIMES DAILY 06/28/21   Deetta Perla, MD  pyridostigmine (MESTINON) 60 MG tablet Take by mouth. 07/23/22 07/23/23  [provider]    Family History History reviewed. No pertinent family history.  Social History Social History   Tobacco Use   Smoking status: Never   Smokeless tobacco: Never  Vaping Use   Vaping Use: Never used  Substance Use Topics   Alcohol use: Yes    Comment: socially   Drug use: Never     Allergies   Patient has no known allergies.   Review of Systems Review of Systems  Constitutional:  Negative for chills and fever.  HENT:  Negative for facial swelling and trouble swallowing.   Eyes:  Negative for discharge and redness.  Respiratory:  Negative for shortness of breath.   Gastrointestinal:  Negative for abdominal pain, nausea and vomiting.  Skin:  Positive for rash.     Physical Exam Triage Vital Signs ED Triage Vitals [12/10/22  1103]  Enc Vitals Group     BP 97/67     Pulse Rate 69     Resp 16     Temp 98.3 F (36.8 C)     Temp Source Oral     SpO2 98 %     Weight      Height      Head Circumference      Peak Flow      Pain Score 0     Pain Loc      Pain Edu?      Excl. in GC?    No data found.  Updated Vital Signs BP 97/67 (BP Location: Left Arm)   Pulse 69   Temp 98.3 F (36.8 C) (Oral)   Resp 16   SpO2 98%      Physical Exam Vitals and nursing note reviewed.  Constitutional:      General: She is not in acute distress.    Appearance: Normal appearance. She is not ill-appearing.  HENT:     Head: Normocephalic and atraumatic.     Nose: No congestion.   Eyes:     Conjunctiva/sclera: Conjunctivae normal.  Cardiovascular:     Rate and Rhythm: Normal rate.  Pulmonary:     Effort: Pulmonary effort is normal.  Skin:    Findings: Rash (dry scaling annular or ellipitcal lesions to arms, abdomen, no central cleearing) present.  Neurological:     Mental Status: She is alert.  Psychiatric:        Mood and Affect: Mood normal.        Behavior: Behavior normal.        Thought Content: Thought content normal.      UC Treatments / Results  Labs (all labs ordered are listed, but only abnormal results are displayed) Labs Reviewed - No data to display  EKG   Radiology No results found.  Procedures Procedures (including critical care time)  Medications Ordered in UC Medications - No data to display  Initial Impression / Assessment and Plan / UC Course  I have reviewed the triage vital signs and the nursing notes.  Pertinent labs & imaging results that were available during my care of the patient were reviewed by me and considered in my medical decision making (see chart for details).    Suspect nummular eczema versus tinea infection given appearance.  Kenalog cream prescribed and recommended topical treatment for 2 weeks.  Advise she continue eczema moisturizer.  Encouraged follow-up with any further concerns or if symptoms do not improve.  Patient expressed understanding.  Final Clinical Impressions(s) / UC Diagnoses   Final diagnoses:  Eczema, unspecified type   Discharge Instructions   None    ED Prescriptions     Medication Sig Dispense Auth. Provider   triamcinolone cream (KENALOG) 0.1 % Apply 1 Application topically 2 (two) times daily. 30 g Tomi Bamberger, PA-C      PDMP not reviewed this encounter.   Tomi Bamberger, PA-C 12/10/22 1218

## 2022-12-10 NOTE — ED Triage Notes (Signed)
Pt c/o patches of dry itchy skin that started ~ sept/oct that is not going away with otc eczema lotions.

## 2023-02-03 ENCOUNTER — Encounter (HOSPITAL_COMMUNITY): Payer: Self-pay

## 2023-02-03 ENCOUNTER — Ambulatory Visit (HOSPITAL_COMMUNITY)
Admission: RE | Admit: 2023-02-03 | Discharge: 2023-02-03 | Disposition: A | Discharge status: Home / Self Care | Payer: 59 | Source: Ambulatory Visit | Attending: Family Medicine | Admitting: Family Medicine

## 2023-02-03 VITALS — BP 110/73 | HR 77 | Temp 98.2°F | Resp 14

## 2023-02-03 DIAGNOSIS — L309 Dermatitis, unspecified: Secondary | ICD-10-CM | POA: Diagnosis not present

## 2023-02-03 HISTORY — DX: Dermatitis, unspecified: L30.9

## 2023-02-03 MED ORDER — TRIAMCINOLONE ACETONIDE 0.1 % EX CREA: 1.0000 | TOPICAL_CREAM | Freq: Two times a day (BID) | CUTANEOUS | 0 refills | Status: DC

## 2023-02-03 NOTE — Discharge Instructions (Signed)
You were seen today for eczema.  I have refilled the cream to your pharmacy.  I do recommend you make an appointment with a primary care provider for long term treatment.  You may go to [email or web address] to find a provider.

## 2023-02-03 NOTE — ED Provider Notes (Signed)
[place]    CSN: [id number] Arrival date & time: 02/03/23  1205      History   Chief Complaint Chief Complaint  Patient presents with   appt 12   Skin Problem    HPI Autumn Hubbard is a 22 y.o. female.   Patient is here for a flare of ezcema.  This started initially this started in sept/oct.  She was seen in December and given a cream.  The cream did keep it at [place].  She ran out and symptoms have worsened again . This never fully went away.  She does not have a pcp.        Past Medical History:  Diagnosis Date   Eczema    Myasthenia gravis [facility])     Patient Active Problem List   Diagnosis Date Noted   Myasthenia gravis with acute exacerbation ([place]) 04/27/2019   Poor sleep hygiene 04/27/2019   Steroid-induced gastritis 04/27/2019   Myasthenia gravis ([place]) 12/14/2017    History reviewed. No pertinent surgical history.  OB History   No obstetric history on file.      Home Medications    Prior to Admission medications   Medication Sig Start Date End Date Taking? Authorizing Provider  amoxicillin (AMOXIL) 875 MG tablet Take 1 tablet (875 mg total) by mouth 2 (two) times daily. 04/27/22   Jaynee Eagles, PA-C  azaTHIOprine (IMURAN) 50 MG tablet TAKE 1 AND 1/2 TABLETS BY  MOUTH DAILY 06/28/21   Jodi Geralds, MD  ferrous sulfate 325 (65 FE) MG tablet Take 325 mg by mouth 2 (two) times daily with a meal. 04/04/19   [provider]  ferrous sulfate 325 (65 FE) MG tablet Take by mouth. 04/04/19   [provider]  ibuprofen (ADVIL) 600 MG tablet Take 1 tablet (600 mg total) by mouth every 6 (six) hours as needed. 08/25/22   Talbot Grumbling, FNP  omeprazole (PRILOSEC) 20 MG capsule TAKE 1 CAPSULE BY MOUTH EVERY DAY 06/28/21   Jodi Geralds, MD  predniSONE (DELTASONE) 5 MG tablet Take 1 tablet every other day 06/28/21   Jodi Geralds, MD  predniSONE (DELTASONE) 5 MG tablet Take by mouth. 09/04/21   [provider]   pyridostigmine (MESTINON) 60 MG tablet TAKE ONE-HALF TABLET BY  MOUTH 4 TIMES DAILY 06/28/21   Jodi Geralds, MD  pyridostigmine (MESTINON) 60 MG tablet Take by mouth. 07/23/22 07/23/23  [provider]  triamcinolone cream (KENALOG) 0.1 % Apply 1 Application topically 2 (two) times daily. 12/10/22   Francene Finders, PA-C    Family History No family history on file.  Social History Social History   Tobacco Use   Smoking status: Never   Smokeless tobacco: Never  Vaping Use   Vaping Use: Never used  Substance Use Topics   Alcohol use: Yes    Comment: socially   Drug use: Never     Allergies   Patient has no known allergies.   Review of Systems Review of Systems  Constitutional: Negative.   HENT: Negative.    Respiratory: Negative.    Cardiovascular: Negative.   Gastrointestinal: Negative.   Musculoskeletal: Negative.   Skin:  Positive for rash.     Physical Exam Triage Vital Signs ED Triage Vitals  Enc Vitals Group     BP 02/03/23 1216 110/73     Pulse Rate 02/03/23 1216 77     Resp 02/03/23 1216 14     Temp 02/03/23 1216 98.2  F (36.8 C)     Temp Source 02/03/23 1216 Oral     SpO2 02/03/23 1216 98 %     Weight --      Height --      Head Circumference --      Peak Flow --      Pain Score 02/03/23 1215 0     Pain Loc --      Pain Edu? --      Excl. in [place]? --    No data found.  Updated Vital Signs BP 110/73 (BP Location: Right Arm)   Pulse 77   Temp 98.2 F (36.8 C) (Oral)   Resp 14   LMP 01/13/2023   SpO2 98%   Visual Acuity Right Eye Distance:   Left Eye Distance:   Bilateral Distance:    Right Eye Near:   Left Eye Near:    Bilateral Near:     Physical Exam Constitutional:      Appearance: Normal appearance.  Cardiovascular:     Rate and Rhythm: Normal rate and regular rhythm.  Pulmonary:     Effort: Pulmonary effort is normal.  Skin:    Comments: Dry patches of skin at the left upper shoulder, down the left arm, and  at the left flank;  not raised, not vesicular  Neurological:     General: No focal deficit present.     Mental Status: She is alert.  Psychiatric:        Mood and Affect: Mood normal.      UC Treatments / Results  Labs (all labs ordered are listed, but only abnormal results are displayed) Labs Reviewed - No data to display  EKG   Radiology No results found.  Procedures Procedures (including critical care time)  Medications Ordered in UC Medications - No data to display  Initial Impression / Assessment and Plan / UC Course  I have reviewed the triage vital signs and the nursing notes.  Pertinent labs & imaging results that were available during my care of the patient were reviewed by me and considered in my medical decision making (see chart for details).   Final Clinical Impressions(s) / UC Diagnoses   Final diagnoses:  Eczema, unspecified type     Discharge Instructions      You were seen today for eczema.  I have refilled the cream to your pharmacy.  I do recommend you make an appointment with a primary care provider for long term treatment.  You may go to [email or web address] to find a provider.     ED Prescriptions     Medication Sig Dispense Auth. Provider   triamcinolone cream (KENALOG) 0.1 % Apply 1 Application topically 2 (two) times daily. 30 g Rondel Oh, MD      PDMP not reviewed this encounter.   Rondel Oh, MD 02/03/23 1227

## 2023-02-03 NOTE — ED Triage Notes (Signed)
Pt reports that having issues with eczema really bad since October. Got cream in past that helped. Reports itches.

## 2023-03-14 ENCOUNTER — Ambulatory Visit (HOSPITAL_COMMUNITY)
Admission: RE | Admit: 2023-03-14 | Discharge: 2023-03-14 | Disposition: A | Discharge status: Home / Self Care | Payer: 59 | Source: Ambulatory Visit | Attending: Emergency Medicine | Admitting: Emergency Medicine

## 2023-03-14 VITALS — BP 105/76 | HR 94 | Temp 98.1°F | Resp 16 | Ht 65.0 in | Wt 114.0 lb

## 2023-03-14 DIAGNOSIS — R519 Headache, unspecified: Secondary | ICD-10-CM | POA: Diagnosis present

## 2023-03-14 DIAGNOSIS — J069 Acute upper respiratory infection, unspecified: Secondary | ICD-10-CM | POA: Diagnosis present

## 2023-03-14 DIAGNOSIS — Z1152 Encounter for screening for COVID-19: Secondary | ICD-10-CM | POA: Diagnosis not present

## 2023-03-14 DIAGNOSIS — Z3202 Encounter for pregnancy test, result negative: Secondary | ICD-10-CM | POA: Diagnosis not present

## 2023-03-14 LAB — POC URINE PREG, ED: Preg Test, Ur: NEGATIVE

## 2023-03-14 MED ORDER — FLUTICASONE PROPIONATE 50 MCG/ACT NA SUSP: 2.0000 | Freq: Every day | NASAL | 0 refills | Status: DC

## 2023-03-14 MED ORDER — GUAIFENESIN ER 600 MG PO TB12: 1200.0000 mg | ORAL_TABLET | Freq: Two times a day (BID) | ORAL | 0 refills | Status: AC

## 2023-03-14 MED ORDER — KETOROLAC TROMETHAMINE 60 MG/2ML IM SOLN
60.0000 mg | Freq: Once | INTRAMUSCULAR | Status: AC
  Administered 2023-03-14: 60 mg via INTRAMUSCULAR

## 2023-03-14 MED ORDER — KETOROLAC TROMETHAMINE 60 MG/2ML IM SOLN
INTRAMUSCULAR | Status: AC
  Filled 2023-03-14: qty 2

## 2023-03-14 NOTE — ED Triage Notes (Signed)
Patient here for body aches, fever, chills, sweats, nasal congestion, and HA X 4 days. Patient has taken allegra and benadryl with no relief. She took Excedrin yesterday which helped some. She has also had some sensitivity to light. She traveled to [place] last week.

## 2023-03-14 NOTE — ED Provider Notes (Signed)
[facility]    CSN: [id number] Arrival date & time: 03/14/23  1001      History   Chief Complaint Chief Complaint  Patient presents with   Generalized Body Aches    Congestion, eye sensitivity, - Entered by patient   Fever    HPI Autumn Hubbard is a 22 y.o. female.   Patient reports body aches, bilateral eye watering, headaches, non-productive cough, nasal congestion over the past 4 days. She denies chest pain or SOB. Most pressing symptoms are her headache and nasal congestion, headache is improving from yesterday.  She recently traveled out of the country.  She has been taking Excedrin, Benadryl and Allegra-D.  Has a history of myasthenia gravis.   The history is provided by the patient.  Fever Associated symptoms: cough and rhinorrhea   Associated symptoms: no chest pain, no chills, no dysuria and no sore throat     Past Medical History:  Diagnosis Date   Eczema    Myasthenia gravis [facility])     Patient Active Problem List   Diagnosis Date Noted   Myasthenia gravis with acute exacerbation ([place]) 04/27/2019   Poor sleep hygiene 04/27/2019   Steroid-induced gastritis 04/27/2019   Myasthenia gravis ([place]) 12/14/2017    No past surgical history on file.  OB History   No obstetric history on file.      Home Medications    Prior to Admission medications   Medication Sig Start Date End Date Taking? Authorizing Provider  azaTHIOprine (IMURAN) 50 MG tablet Take 75 mg by mouth daily.   Yes [provider]  fluticasone (FLONASE) 50 MCG/ACT nasal spray Place 2 sprays into both nostrils daily for 5 days. 03/14/23 03/19/23 Yes Louretta Shorten, [place] N, FNP  guaiFENesin (MUCINEX) 600 MG 12 hr tablet Take 2 tablets (1,200 mg total) by mouth 2 (two) times daily for 5 days. 03/14/23 03/19/23 Yes Louretta Shorten, [place] N, FNP  ibuprofen (ADVIL) 600 MG tablet Take 1 tablet (600 mg total) by mouth every 6 (six) hours as needed. 08/25/22   Talbot Grumbling, FNP   pyridostigmine (MESTINON) 60 MG tablet TAKE ONE-HALF TABLET BY  MOUTH 4 TIMES DAILY 06/28/21   Jodi Geralds, MD  pyridostigmine (MESTINON) 60 MG tablet Take by mouth. 07/23/22 07/23/23  [provider]  triamcinolone cream (KENALOG) 0.1 % Apply 1 Application topically 2 (two) times daily. 02/03/23   Rondel Oh, MD    Family History No family history on file.  Social History Social History   Tobacco Use   Smoking status: Never   Smokeless tobacco: Never  Vaping Use   Vaping Use: Never used  Substance Use Topics   Alcohol use: Yes    Comment: socially   Drug use: Never     Allergies   Patient has no known allergies.   Review of Systems Review of Systems  Constitutional:  Positive for fever. Negative for chills.  HENT:  Positive for postnasal drip and rhinorrhea. Negative for sore throat.   Eyes:  Negative for pain, discharge, redness and itching.  Respiratory:  Positive for cough. Negative for shortness of breath and wheezing.   Cardiovascular:  Negative for chest pain.  Genitourinary:  Negative for dysuria.     Physical Exam Triage Vital Signs ED Triage Vitals [03/14/23 1028]  Enc Vitals Group     BP 105/76     Pulse Rate 94     Resp 16     Temp 98.1 F (36.7 C)  Temp Source Oral     SpO2 98 %     Weight 114 lb (51.7 kg)     Height 5\' 5"  (1.651 m)     Head Circumference      Peak Flow      Pain Score      Pain Loc      Pain Edu?      Excl. in [place]?    No data found.  Updated Vital Signs BP 105/76 (BP Location: Left Arm)   Pulse 94   Temp 98.1 F (36.7 C) (Oral)   Resp 16   Ht 5\' 5"  (1.651 m)   Wt 114 lb (51.7 kg)   LMP 02/11/2023 (Exact Date)   SpO2 98%   BMI 18.97 kg/m   Visual Acuity Right Eye Distance:   Left Eye Distance:   Bilateral Distance:    Right Eye Near:   Left Eye Near:    Bilateral Near:     Physical Exam Vitals and nursing note reviewed.  Constitutional:      General: She is not in acute distress.     Appearance: She is well-developed.  HENT:     Head: Normocephalic and atraumatic.     Right Ear: External ear normal.     Left Ear: External ear normal.     Nose: Congestion and rhinorrhea present.     Mouth/Throat:     Mouth: Mucous membranes are moist.     Pharynx: Uvula midline. Posterior oropharyngeal erythema present. No pharyngeal swelling, oropharyngeal exudate or uvula swelling.     Tonsils: No tonsillar exudate or tonsillar abscesses.  Eyes:     General: No scleral icterus.       Right eye: No discharge.        Left eye: No discharge.     Conjunctiva/sclera: Conjunctivae normal.     Pupils: Pupils are equal, round, and reactive to light.  Cardiovascular:     Rate and Rhythm: Normal rate and regular rhythm.     Heart sounds: Normal heart sounds, S1 normal and S2 normal. No murmur heard. Pulmonary:     Effort: Pulmonary effort is normal. No respiratory distress.     Breath sounds: Normal breath sounds.     Comments: Lungs vesicular posteriorly. Musculoskeletal:        General: Normal range of motion.     Cervical back: Normal range of motion and neck supple.  Lymphadenopathy:     Cervical: Cervical adenopathy present.  Skin:    General: Skin is warm and dry.     Capillary Refill: Capillary refill takes less than 2 seconds.  Neurological:     Mental Status: She is alert.  Psychiatric:        Mood and Affect: Mood normal.        Behavior: Behavior is cooperative.      UC Treatments / Results  Labs (all labs ordered are listed, but only abnormal results are displayed) Labs Reviewed  SARS CORONAVIRUS 2 (TAT 6-24 HRS)  POC URINE PREG, ED    EKG   Radiology No results found.  Procedures Procedures (including critical care time)  Medications Ordered in UC Medications  ketorolac (TORADOL) injection 60 mg (60 mg Intramuscular Given 03/14/23 1106)    Initial Impression / Assessment and Plan / UC Course  I have reviewed the triage vital signs and the nursing  notes.  Pertinent labs & imaging results that were available during my care of the patient were reviewed by me  and considered in my medical decision making (see chart for details).  Vitals and triage reviewed, patient is hemodynamically stable.  Patient with 4 days of bodyaches, headache, nasal congestion and fever, will test for COVID-19. PE w/ PND and posterior pharynx erythema, cervical LAD. Lungs vesicular, no hx of asthma or smoking, will withhold steroids at this time. Discussed symptomatic treatment for viral upper respiratory illness.  Due to ongoing headache, given dose of IM Toradol in clinic after negative urine pregnancy test, advised expectorants and Flonase for symptomatic relief. Feel as if HA is r/t nasal congestion and viral URI. Plan of care, treatment plan, return precautions discussed, patient mother verbalized understanding, no questions at this time.    Final Clinical Impressions(s) / UC Diagnoses   Final diagnoses:  Viral upper respiratory infection  Acute intractable headache, unspecified headache type     Discharge Instructions      Overall your physical exam was reassuring, your symptoms are consistent with a viral upper respiratory infection.  We have swabbed you for COVID-19 and will call if results are positive.  For your headache, we have given you a dose of Toradol.  Please do not use any ibuprofen, naproxen or other NSAIDs for the next 12 hours.  If your fever returns, you can take Tylenol today.  Over the next few days you can alternate between Tylenol and ibuprofen every 4-6 hours as needed for fevers, body aches and discomfort.  Please use the Mucinex and Flonase as prescribed, drink at least 64 ounces of water daily and sleep with a humidifier to help with congestion.  Please return to clinic or follow-up with your primary care if your symptoms persist beyond a week, you develop fevers despite medication, sinus pain, pressure, or other changes in symptoms as  this would require reevaluation.      ED Prescriptions     Medication Sig Dispense Auth. Provider   guaiFENesin (MUCINEX) 600 MG 12 hr tablet Take 2 tablets (1,200 mg total) by mouth 2 (two) times daily for 5 days. 20 tablet Louretta Shorten, [place] N, FNP   fluticasone [facility]) 50 MCG/ACT nasal spray Place 2 sprays into both nostrils daily for 5 days. 9.9 mL Seve Monette, [place] N, FNP      I have reviewed the PDMP during this encounter.   Jaquelinne Glendening, [place] N, [place] 03/14/23 [id number]

## 2023-03-14 NOTE — Discharge Instructions (Addendum)
Overall your physical exam was reassuring, your symptoms are consistent with a viral upper respiratory infection.  We have swabbed you for COVID-19 and will call if results are positive.  For your headache, we have given you a dose of Toradol.  Please do not use any ibuprofen, naproxen or other NSAIDs for the next 12 hours.  If your fever returns, you can take Tylenol today.  Over the next few days you can alternate between Tylenol and ibuprofen every 4-6 hours as needed for fevers, body aches and discomfort.  Please use the Mucinex and Flonase as prescribed, drink at least 64 ounces of water daily and sleep with a humidifier to help with congestion.  Please return to clinic or follow-up with your primary care if your symptoms persist beyond a week, you develop fevers despite medication, sinus pain, pressure, or other changes in symptoms as this would require reevaluation.

## 2023-03-15 LAB — SARS CORONAVIRUS 2 (TAT 6-24 HRS): SARS Coronavirus 2: NEGATIVE

## 2023-08-21 ENCOUNTER — Encounter (HOSPITAL_COMMUNITY): Payer: Self-pay

## 2023-08-21 ENCOUNTER — Ambulatory Visit (HOSPITAL_COMMUNITY): Payer: 59

## 2023-08-21 ENCOUNTER — Ambulatory Visit (HOSPITAL_COMMUNITY)
Admission: RE | Admit: 2023-08-21 | Discharge: 2023-08-21 | Disposition: A | Discharge status: Home / Self Care | Payer: 59 | Source: Ambulatory Visit | Attending: Internal Medicine | Admitting: Internal Medicine

## 2023-08-21 VITALS — BP 104/73 | HR 84 | Temp 97.9°F | Resp 18

## 2023-08-21 DIAGNOSIS — N390 Urinary tract infection, site not specified: Secondary | ICD-10-CM | POA: Insufficient documentation

## 2023-08-21 LAB — POCT URINALYSIS DIP (MANUAL ENTRY)
Glucose, UA: 100 mg/dL — AB
Nitrite, UA: POSITIVE — AB
Protein Ur, POC: 100 mg/dL — AB
Spec Grav, UA: 1.025 (ref 1.010–1.025)
Urobilinogen, UA: 2 E.U./dL — AB
pH, UA: 5 (ref 5.0–8.0)

## 2023-08-21 MED ORDER — CEPHALEXIN 500 MG PO CAPS: 500.0000 mg | ORAL_CAPSULE | Freq: Two times a day (BID) | ORAL | 0 refills | Status: DC

## 2023-08-21 NOTE — Discharge Instructions (Signed)
We are treating you for a UTI.  Take cephalexin twice daily for 5 days.  Make sure that you rest and drink plenty of fluid.  We will contact you if we need to stop or change your antibiotics based on the culture results.  If you have any worsening symptoms including abdominal pain, pelvic pain, fever, nausea, vomiting you need to be seen immediately.

## 2023-08-21 NOTE — ED Triage Notes (Signed)
Pt c/o urinary urgency, discomfort, cloudy and odorous x2 days. States took AZO last night with little relief.

## 2023-08-21 NOTE — ED Provider Notes (Signed)
[facility]    CSN: [id number] Arrival date & time: 08/21/23  1220      History   Chief Complaint Chief Complaint  Patient presents with   Urinary Tract Infection    HPI Zeyah Cobo is a 22 y.o. female.   Patient presents today with a 2-day history of UTI symptoms.  She reports urinary urgency and cloudy urine.  She denies any dysuria, abdominal pain, pelvic pain, vaginal symptoms, fever, nausea, vomiting.  She has tried Azo with temporary improvement of symptoms.  She denies any recent antibiotic use.  She does take immune modulating medication including Imuran for myasthenia gravis.  She denies any recent urogenital procedure, self-catheterization, history of nephrolithiasis, single kidney.  She has never had a UTI in the past and has not seen a urologist.  LMP 08/09/2023 so she has no concern for pregnancy.  She denies history of diabetes and does not take SGLT2 inhibitor.    Past Medical History:  Diagnosis Date   Eczema    Myasthenia gravis [facility])     Patient Active Problem List   Diagnosis Date Noted   Myasthenia gravis with acute exacerbation ([facility]) 04/27/2019   Poor sleep hygiene 04/27/2019   Steroid-induced gastritis 04/27/2019   Myasthenia gravis ([facility]) 12/14/2017    History reviewed. No pertinent surgical history.  OB History   No obstetric history on file.      Home Medications    Prior to Admission medications   Medication Sig Start Date End Date Taking? Authorizing Provider  cephALEXin (KEFLEX) 500 MG capsule Take 1 capsule (500 mg total) by mouth 2 (two) times daily. 08/21/23  Yes Samarra Ridgely, Noberto Retort, PA-C  azaTHIOprine (IMURAN) 50 MG tablet Take 75 mg by mouth daily.    [provider]  fluticasone (FLONASE) 50 MCG/ACT nasal spray Place 2 sprays into both nostrils daily for 5 days. 03/14/23 03/19/23  Garrison, [place] N, FNP  ibuprofen (ADVIL) 600 MG tablet Take 1 tablet (600 mg total) by mouth every 6 (six) hours as needed. 08/25/22    Carlisle Beers, FNP  pyridostigmine (MESTINON) 60 MG tablet TAKE ONE-HALF TABLET BY  MOUTH 4 TIMES DAILY 06/28/21   Deetta Perla, MD  pyridostigmine (MESTINON) 60 MG tablet Take by mouth. 07/23/22 07/23/23  [provider]  triamcinolone cream (KENALOG) 0.1 % Apply 1 Application topically 2 (two) times daily. 02/03/23   Jannifer Franklin, MD    Family History History reviewed. No pertinent family history.  Social History Social History   Tobacco Use   Smoking status: Never   Smokeless tobacco: Never  Vaping Use   Vaping status: Never Used  Substance Use Topics   Alcohol use: Yes    Comment: socially   Drug use: Never     Allergies   Patient has no known allergies.   Review of Systems Review of Systems  Constitutional:  Positive for activity change. Negative for appetite change, fatigue and fever.  Gastrointestinal:  Negative for abdominal pain, diarrhea, nausea and vomiting.  Genitourinary:  Positive for urgency. Negative for dysuria, frequency, hematuria, vaginal bleeding, vaginal discharge and vaginal pain.     Physical Exam Triage Vital Signs ED Triage Vitals [08/21/23 1233]  Encounter Vitals Group     BP 104/73     Systolic BP Percentile      Diastolic BP Percentile      Pulse Rate 84     Resp 18     Temp 97.9 F (36.6 C)  Temp Source Oral     SpO2 97 %     Weight      Height      Head Circumference      Peak Flow      Pain Score 0     Pain Loc      Pain Education      Exclude from Growth Chart    No data found.  Updated Vital Signs BP 104/73 (BP Location: Left Arm)   Pulse 84   Temp 97.9 F (36.6 C) (Oral)   Resp 18   LMP 08/09/2023   SpO2 97%   Visual Acuity Right Eye Distance:   Left Eye Distance:   Bilateral Distance:    Right Eye Near:   Left Eye Near:    Bilateral Near:     Physical Exam Vitals reviewed.  Constitutional:      General: She is awake. She is not in acute distress.    Appearance: Normal  appearance. She is well-developed. She is not ill-appearing.     Comments: Very pleasant female appears stated age in no acute distress sitting comfortably in exam room  HENT:     Head: Normocephalic and atraumatic.  Cardiovascular:     Rate and Rhythm: Normal rate and regular rhythm.     Heart sounds: Normal heart sounds, S1 normal and S2 normal. No murmur heard. Pulmonary:     Effort: Pulmonary effort is normal.     Breath sounds: Normal breath sounds. No wheezing, rhonchi or rales.     Comments: Clear to auscultation bilaterally Abdominal:     General: Bowel sounds are normal.     Palpations: Abdomen is soft.     Tenderness: There is no abdominal tenderness. There is no right CVA tenderness, left CVA tenderness, guarding or rebound.     Comments: Benign abdominal exam  Genitourinary:    Comments: Exam deferred Psychiatric:        Behavior: Behavior is cooperative.      UC Treatments / Results  Labs (all labs ordered are listed, but only abnormal results are displayed) Labs Reviewed  POCT URINALYSIS DIP (MANUAL ENTRY) - Abnormal; Notable for the following components:      Result Value   Color, UA red (*)    Clarity, UA cloudy (*)    Glucose, UA =100 (*)    Bilirubin, UA small (*)    Ketones, POC UA trace (5) (*)    Blood, UA large (*)    Protein Ur, POC =100 (*)    Urobilinogen, UA 2.0 (*)    Nitrite, UA Positive (*)    Leukocytes, UA Large (3+) (*)    All other components within normal limits  URINE CULTURE    EKG   Radiology No results found.  Procedures Procedures (including critical care time)  Medications Ordered in UC Medications - No data to display  Initial Impression / Assessment and Plan / UC Course  I have reviewed the triage vital signs and the nursing notes.  Pertinent labs & imaging results that were available during my care of the patient were reviewed by me and considered in my medical decision making (see chart for details).      Patient is well-appearing, afebrile, nontoxic, nontachycardic.  Vital signs of physical exam are reassuring with no indication for emergent evaluation or imaging.  UA did have evidence of UTI but patient had recently taken Azo.  Will empirically treat with cephalexin 500 mg twice daily for 5 days.  Will send this for culture and we discussed that if we need to change antibiotics based on culture results we will contact her.  She is to rest drink plenty of fluid.  Discussed that if she has any worsening or changing symptoms she needs to be seen immediately including abdominal pain, pelvic pain, fever, nausea, vomiting.  Strict return precautions given.  All questions answered patient satisfaction.  Final Clinical Impressions(s) / UC Diagnoses   Final diagnoses:  Acute UTI     Discharge Instructions      We are treating you for a UTI.  Take cephalexin twice daily for 5 days.  Make sure that you rest and drink plenty of fluid.  We will contact you if we need to stop or change your antibiotics based on the culture results.  If you have any worsening symptoms including abdominal pain, pelvic pain, fever, nausea, vomiting you need to be seen immediately.    ED Prescriptions     Medication Sig Dispense Auth. Provider   cephALEXin (KEFLEX) 500 MG capsule Take 1 capsule (500 mg total) by mouth 2 (two) times daily. 10 capsule Rhaelyn Giron, Noberto Retort, PA-C      PDMP not reviewed this encounter.   Jeani Hawking, PA-C 08/21/23 1310

## 2023-08-23 LAB — URINE CULTURE: Culture: >=100000 — AB

## 2023-11-08 ENCOUNTER — Ambulatory Visit
Admission: RE | Admit: 2023-11-08 | Discharge: 2023-11-08 | Disposition: A | Discharge status: Home / Self Care | Payer: 59 | Source: Ambulatory Visit | Attending: Emergency Medicine | Admitting: Emergency Medicine

## 2023-11-08 VITALS — BP 106/69 | HR 101 | Temp 98.5°F | Resp 17

## 2023-11-08 DIAGNOSIS — Z789 Other specified health status: Secondary | ICD-10-CM | POA: Diagnosis not present

## 2023-11-08 DIAGNOSIS — N3001 Acute cystitis with hematuria: Secondary | ICD-10-CM | POA: Diagnosis not present

## 2023-11-08 LAB — POCT URINALYSIS DIP (MANUAL ENTRY)
Bilirubin, UA: NEGATIVE
Glucose, UA: NEGATIVE mg/dL
Nitrite, UA: NEGATIVE
Protein Ur, POC: >=300 mg/dL — AB
Spec Grav, UA: >=1.03 — AB (ref 1.010–1.025)
Urobilinogen, UA: 0.2 U/dL
pH, UA: 7 (ref 5.0–8.0)

## 2023-11-08 LAB — POCT URINE PREGNANCY: Preg Test, Ur: NEGATIVE

## 2023-11-08 MED ORDER — PHENAZOPYRIDINE HCL 200 MG PO TABS: 200.0000 mg | ORAL_TABLET | Freq: Three times a day (TID) | ORAL | 0 refills | Status: DC

## 2023-11-08 MED ORDER — NITROFURANTOIN MONOHYD MACRO 100 MG PO CAPS: 100.0000 mg | ORAL_CAPSULE | Freq: Two times a day (BID) | ORAL | 0 refills | Status: AC

## 2023-11-08 NOTE — Discharge Instructions (Addendum)
Common causes of urinary tract infections include but are not limited to holding your urine longer than you should, squatting instead of sitting down when urinating, sitting around in wet clothing such as a wet swimsuit or gym clothes too long, not emptying your bladder after having sexual intercourse, wiping from back to front instead of front to back after having a bowel movement.     The urinalysis that we performed in the clinic today was abnormal.     You were advised to begin antibiotics today because your urinalysis is abnormal and you are having active symptoms of an acute lower urinary tract infection also known as cystitis.     Please pick up and begin taking your prescription for Macrobid (nitrofurantoin) as soon as possible.  Please take all doses exactly as prescribed.  You can take this medication with or without food.  This medication is safe to take with your other medications.  I have also sent a prescription for Pyridium which is an analgesic that will provide you with pain relief while you are waiting for your antibiotic to take full effect.   If you have not had complete resolution of your symptoms after completing treatment as prescribed, please return to urgent care for repeat evaluation or follow-up with your primary care provider.  Repeat urinalysis and urine culture may be indicated for more directed therapy.   Thank you for visiting [facility] today.  We appreciate the opportunity to participate in your care.

## 2023-11-08 NOTE — ED Triage Notes (Signed)
Pt c/o urinary urgency with post-void pressure since yesterday. Denies dysuria, n/v, back or abd pain. No vaginal irration or discharge.

## 2023-11-08 NOTE — ED Provider Notes (Addendum)
[facility]    CSN: [id number] Arrival date & time: 11/08/23  1448    HISTORY   Chief Complaint  Patient presents with   Urinary Urgency   HPI Autumn Hubbard is a pleasant, 22 y.o. female who presents to urgent care today. Patient complains of increased urge to urinate, postvoid pressure x 1 day. Patient denies abnormal vaginal discharge, vaginal itching, vaginal irritation, burning during urination, increased frequency of urination, flank pain, fever , body aches, chills, rigors, malaise, and significant fatigue.   Past Medical History:  Diagnosis Date   Eczema    Myasthenia gravis [facility])    Patient Active Problem List   Diagnosis Date Noted   Myasthenia gravis with acute exacerbation (HCC) 04/27/2019   Poor sleep hygiene 04/27/2019   Steroid-induced gastritis 04/27/2019   Myasthenia gravis (HCC) 12/14/2017   History reviewed. No pertinent surgical history. OB History   No obstetric history on file.    Home Medications    Prior to Admission medications   Medication Sig Start Date End Date Taking? Authorizing Provider  pyridostigmine (MESTINON) 60 MG tablet TAKE ONE-HALF TABLET BY  MOUTH 4 TIMES DAILY 06/28/21  Yes Deetta Perla, MD  azaTHIOprine (IMURAN) 50 MG tablet Take 75 mg by mouth daily. Patient not taking: Reported on 11/08/2023    [provider]  cephALEXin (KEFLEX) 500 MG capsule Take 1 capsule (500 mg total) by mouth 2 (two) times daily. Patient not taking: Reported on 11/08/2023 08/21/23   Raspet, Denny Peon K, PA-C  fluticasone (FLONASE) 50 MCG/ACT nasal spray Place 2 sprays into both nostrils daily for 5 days. 03/14/23 03/19/23  Garrison, [place] N, FNP  ibuprofen (ADVIL) 600 MG tablet Take 1 tablet (600 mg total) by mouth every 6 (six) hours as needed. Patient not taking: Reported on 11/08/2023 08/25/22   Carlisle Beers, FNP  pyridostigmine (MESTINON) 60 MG tablet Take by mouth. 07/23/22 07/23/23  [provider]   triamcinolone cream (KENALOG) 0.1 % Apply 1 Application topically 2 (two) times daily. Patient not taking: Reported on 11/08/2023 02/03/23   Jannifer Franklin, MD    Family History No family history on file. Social History Social History   Tobacco Use   Smoking status: Never   Smokeless tobacco: Never  Vaping Use   Vaping status: Never Used  Substance Use Topics   Alcohol use: Yes    Comment: socially   Drug use: Never   Allergies   Patient has no known allergies.  Review of Systems Review of Systems Pertinent findings revealed after performing a 14 point review of systems has been noted in the history of present illness.  Physical Exam Vital Signs BP 106/69 (BP Location: Right Arm)   Pulse (!) 101   Temp 98.5 F (36.9 C) (Oral)   Resp 17   SpO2 94%   No data found.  Physical Exam  Visual Acuity Right Eye Distance:   Left Eye Distance:   Bilateral Distance:    Right Eye Near:   Left Eye Near:    Bilateral Near:     UC Couse / Diagnostics / Procedures:     Radiology No results found.  Procedures Procedures (including critical care time) EKG  Pending results:  Labs Reviewed  POCT URINALYSIS DIP (MANUAL ENTRY) - Abnormal; Notable for the following components:      Result Value   Clarity, UA cloudy (*)    Ketones, POC UA trace (5) (*)    Spec Grav, UA >=1.030 (*)  Blood, UA large (*)    Protein Ur, POC >=300 (*)    Leukocytes, UA Moderate (2+) (*)    All other components within normal limits  POCT URINE PREGNANCY    Medications Ordered in UC: Medications - No data to display  UC Diagnoses / Final Clinical Impressions(s)   I have reviewed the triage vital signs and the nursing notes.  Pertinent labs & imaging results that were available during my care of the patient were reviewed by me and considered in my medical decision making (see chart for details).    Final diagnoses:  Acute cystitis with hematuria  Not currently pregnant   Urine dip  today revealed hematuria to a large degree and pyuria.   Patient was advised to begin antibiotics now due to findings on urine dip. Patient was advised to begin antibiotics today due to having active symptoms of urinary tract infection.     Patient provided with a prescription for Prodium for symptomatic relief.                Patient was advised to take all doses exactly as prescribed.  Patient also advised of risks of worsening infection with incomplete antibiotic therapy. Return precautions advised.  Please see discharge instructions below for details of plan of care as provided to patient. ED Prescriptions     Medication Sig Dispense Auth. Provider   nitrofurantoin, macrocrystal-monohydrate, (MACROBID) 100 MG capsule Take 1 capsule (100 mg total) by mouth 2 (two) times daily for 5 days. 10 capsule Theadora Rama Scales, PA-C   phenazopyridine (PYRIDIUM) 200 MG tablet Take 1 tablet (200 mg total) by mouth 3 (three) times daily. 6 tablet Theadora Rama Scales, PA-C      PDMP not reviewed this encounter.  Disposition Upon Discharge:  Condition: stable for discharge home  Patient presented with concern for an acute illness with associated systemic symptoms and significant discomfort requiring urgent management. In my opinion, this is a condition that a prudent lay person (someone who possesses an average knowledge of health and medicine) may potentially expect to result in complications if not addressed urgently such as respiratory distress, impairment of bodily function or dysfunction of bodily organs.   As such, the patient has been evaluated and assessed, work-up was performed and treatment was provided in alignment with urgent care protocols and evidence based medicine.  Patient/parent/caregiver has been advised that the patient may require follow up for further testing and/or treatment if the symptoms continue in spite of treatment, as clinically indicated and appropriate.  Routine  symptom specific, illness specific and/or disease specific instructions were discussed with the patient and/or caregiver at length.  Prevention strategies for avoiding STD exposure were also discussed.  The patient will follow up with their current PCP if and as advised. If the patient does not currently have a PCP we will assist them in obtaining one.   The patient may need specialty follow up if the symptoms continue, in spite of conservative treatment and management, for further workup, evaluation, consultation and treatment as clinically indicated and appropriate.  Patient/parent/caregiver verbalized understanding and agreement of plan as discussed.  All questions were addressed during visit.  Please see discharge instructions below for further details of plan.  Discharge Instructions:   Discharge Instructions      Common causes of urinary tract infections include but are not limited to holding your urine longer than you should, squatting instead of sitting down when urinating, sitting around in wet clothing such as a  wet swimsuit or gym clothes too long, not emptying your bladder after having sexual intercourse, wiping from back to front instead of front to back after having a bowel movement.     The urinalysis that we performed in the clinic today was abnormal.     You were advised to begin antibiotics today because your urinalysis is abnormal and you are having active symptoms of an acute lower urinary tract infection also known as cystitis.     Please pick up and begin taking your prescription for Macrobid (nitrofurantoin) as soon as possible.  Please take all doses exactly as prescribed.  You can take this medication with or without food.  This medication is safe to take with your other medications.  I have also sent a prescription for Pyridium which is an analgesic that will provide you with pain relief while you are waiting for your antibiotic to take full effect.   If you have not  had complete resolution of your symptoms after completing treatment as prescribed, please return to urgent care for repeat evaluation or follow-up with your primary care provider.  Repeat urinalysis and urine culture may be indicated for more directed therapy.   Thank you for visiting [facility] today.  We appreciate the opportunity to participate in your care.       This office note has been dictated using Teaching laboratory technician.  Unfortunately, this method of dictation can sometimes lead to typographical or grammatical errors.  I apologize for your inconvenience in advance if this occurs.  Please do not hesitate to reach out to me if clarification is needed.       Theadora Rama Scales, PA-C 11/08/23 1515    Theadora Rama [place], [place] 11/08/23 [phone]

## 2023-11-17 ENCOUNTER — Ambulatory Visit
Admission: RE | Admit: 2023-11-17 | Discharge: 2023-11-17 | Disposition: A | Discharge status: Home / Self Care | Payer: 59 | Source: Ambulatory Visit | Attending: Emergency Medicine | Admitting: Emergency Medicine

## 2023-11-17 VITALS — BP 114/72 | HR 93 | Temp 98.5°F | Resp 16 | Ht 65.0 in | Wt 110.0 lb

## 2023-11-17 DIAGNOSIS — N3000 Acute cystitis without hematuria: Secondary | ICD-10-CM | POA: Diagnosis not present

## 2023-11-17 LAB — POCT URINALYSIS DIP (MANUAL ENTRY)
Bilirubin, UA: NEGATIVE
Glucose, UA: NEGATIVE mg/dL
Ketones, POC UA: NEGATIVE mg/dL
Nitrite, UA: POSITIVE — AB
Protein Ur, POC: 100 mg/dL — AB
Spec Grav, UA: >=1.03 — AB (ref 1.010–1.025)
Urobilinogen, UA: 0.2 U/dL
pH, UA: 6 (ref 5.0–8.0)

## 2023-11-17 LAB — POCT URINE PREGNANCY: Preg Test, Ur: NEGATIVE

## 2023-11-17 MED ORDER — CIPROFLOXACIN HCL 500 MG PO TABS: 500.0000 mg | ORAL_TABLET | Freq: Two times a day (BID) | ORAL | 0 refills | Status: AC

## 2023-11-17 NOTE — Discharge Instructions (Addendum)
Your urinalysis shows Autumn Hubbard blood cells and nitrates which are indicative of infection, your urine will be sent to the lab to determine exactly which bacteria is present, if any changes need to be made to your medications you will be notified  Begin use of Cipro every morning and every evening for 5 days  You may use over-the-counter Azo to help minimize your symptoms until antibiotic removes bacteria, this medication will turn your urine orange  Increase your fluid intake through use of water  As always practice good hygiene, wiping front to back and avoidance of scented vaginal products to prevent further irritation  You have had 3 infections within the last year therefore please follow-up with urology which is the bladder specialist for further evaluation and management

## 2023-11-17 NOTE — ED Provider Notes (Signed)
[facility]    CSN: [id number] Arrival date & time: 11/17/23  1017      History   Chief Complaint Chief Complaint  Patient presents with   Urinary Frequency    HPI Autumn Hubbard is a 22 y.o. female.   Patient presents for evaluation of urinary urgency and lower abdominal pressure beginning 1 day ago.  Has not attempted treatment of new symptoms.  Recently treated with Macrobid for UTI 1 week ago, stop use of medication approximately 3 days ago.  Denies dysuria, hematuria, frequency, abdominal or back pain, fevers.  Past Medical History:  Diagnosis Date   Eczema    Myasthenia gravis [facility])     Patient Active Problem List   Diagnosis Date Noted   Myasthenia gravis with acute exacerbation (HCC) 04/27/2019   Poor sleep hygiene 04/27/2019   Steroid-induced gastritis 04/27/2019   Myasthenia gravis (HCC) 12/14/2017    History reviewed. No pertinent surgical history.  OB History   No obstetric history on file.      Home Medications    Prior to Admission medications   Medication Sig Start Date End Date Taking? Authorizing Provider  ciprofloxacin (CIPRO) 500 MG tablet Take 1 tablet (500 mg total) by mouth 2 (two) times daily for 5 days. 11/17/23 11/22/23 Yes Yaretzy Olazabal R, NP  fluticasone (FLONASE) 50 MCG/ACT nasal spray Place 2 sprays into both nostrils daily for 5 days. Patient not taking: Reported on 11/17/2023 03/14/23 03/19/23  Garrison, [place] N, FNP  phenazopyridine (PYRIDIUM) 200 MG tablet Take 1 tablet (200 mg total) by mouth 3 (three) times daily. Patient not taking: Reported on 11/17/2023 11/08/23   Theadora Rama Scales, PA-C  pyridostigmine (MESTINON) 60 MG tablet TAKE ONE-HALF TABLET BY  MOUTH 4 TIMES DAILY 06/28/21   Deetta Perla, MD    Family History History reviewed. No pertinent family history.  Social History Social History   Tobacco Use   Smoking status: Never   Smokeless tobacco: Never  Vaping Use   Vaping status: Never  Used  Substance Use Topics   Alcohol use: Yes    Comment: socially   Drug use: Never     Allergies   Patient has no known allergies.   Review of Systems Review of Systems   Physical Exam Triage Vital Signs ED Triage Vitals  Encounter Vitals Group     BP 11/17/23 1033 114/72     Systolic BP Percentile --      Diastolic BP Percentile --      Pulse Rate 11/17/23 1033 93     Resp 11/17/23 1033 16     Temp 11/17/23 1033 98.5 F (36.9 C)     Temp src --      SpO2 11/17/23 1033 98 %     Weight 11/17/23 1032 110 lb (49.9 kg)     Height 11/17/23 1032 5\' 5"  (1.651 m)     Head Circumference --      Peak Flow --      Pain Score 11/17/23 1032 0     Pain Loc --      Pain Education --      Exclude from Growth Chart --    No data found.  Updated Vital Signs BP 114/72   Pulse 93   Temp 98.5 F (36.9 C)   Resp 16   Ht 5\' 5"  (1.651 m)   Wt 110 lb (49.9 kg)   LMP 11/12/2023   SpO2 98%   BMI 18.30 kg/m  Visual Acuity Right Eye Distance:   Left Eye Distance:   Bilateral Distance:    Right Eye Near:   Left Eye Near:    Bilateral Near:     Physical Exam Constitutional:      Appearance: Normal appearance.  Eyes:     Extraocular Movements: Extraocular movements intact.  Pulmonary:     Effort: Pulmonary effort is normal.  Genitourinary:    Comments: deferred Neurological:     Mental Status: She is alert and oriented to person, place, and time. Mental status is at baseline.      UC Treatments / Results  Labs (all labs ordered are listed, but only abnormal results are displayed) Labs Reviewed  POCT URINALYSIS DIP (MANUAL ENTRY) - Abnormal; Notable for the following components:      Result Value   Clarity, UA cloudy (*)    Spec Grav, UA >=1.030 (*)    Blood, UA large (*)    Protein Ur, POC =100 (*)    Nitrite, UA Positive (*)    Leukocytes, UA Trace (*)    All other components within normal limits  URINE CULTURE  POCT URINE PREGNANCY     EKG   Radiology No results found.  Procedures Procedures (including critical care time)  Medications Ordered in UC Medications - No data to display  Initial Impression / Assessment and Plan / UC Course  I have reviewed the triage vital signs and the nursing notes.  Pertinent labs & imaging results that were available during my care of the patient were reviewed by me and considered in my medical decision making (see chart for details).  Acute cystitis without hematuria  Urinalysis showing leukocytes and nitrates, sent for culture, last culture available from August 2024 showing E. coli, antibiotic chosen based on this, prescribe Ceftin for ofloxacin as recent use of Macrobid and Keflex, recommended additional supportive measures, walking referral given to urology as she has had 3 infections within the last year, may follow-up with urgent care as needed Final Clinical Impressions(s) / UC Diagnoses   Final diagnoses:  Acute cystitis without hematuria     Discharge Instructions      Your urinalysis shows Helmut Hennon blood cells and nitrates which are indicative of infection, your urine will be sent to the lab to determine exactly which bacteria is present, if any changes need to be made to your medications you will be notified  Begin use of Cipro every morning and every evening for 5 days  You may use over-the-counter Azo to help minimize your symptoms until antibiotic removes bacteria, this medication will turn your urine orange  Increase your fluid intake through use of water  As always practice good hygiene, wiping front to back and avoidance of scented vaginal products to prevent further irritation  You have had 3 infections within the last year therefore please follow-up with urology which is the bladder specialist for further evaluation and management   ED Prescriptions     Medication Sig Dispense Auth. Provider   ciprofloxacin (CIPRO) 500 MG tablet Take 1 tablet (500  mg total) by mouth 2 (two) times daily for 5 days. 10 tablet Valinda Hoar, NP      PDMP not reviewed this encounter.   Valinda Hoar, NP 11/17/23 1048

## 2023-11-17 NOTE — ED Triage Notes (Signed)
Patient to Urgent Care with complaints of urinary urgency/ suprapubic pressure. Denies any known fevers.   Reports UTI over a week ago treated w/ macrobid but feels like her symptoms immediately returned.

## 2023-11-19 LAB — URINE CULTURE: Culture: >=100000 — AB

## 2023-12-24 LAB — HM PAP SMEAR: Chlamydia, Swab/Urine, PCR: NEGATIVE

## 2024-03-28 LAB — HM HEPATITIS C SCREENING LAB: HM Hepatitis Screen: NEGATIVE

## 2024-03-28 LAB — HM HIV SCREENING LAB: HM HIV Screening: NEGATIVE

## 2024-06-13 ENCOUNTER — Telehealth: Admitting: Physician Assistant

## 2024-06-13 DIAGNOSIS — J208 Acute bronchitis due to other specified organisms: Secondary | ICD-10-CM

## 2024-06-13 DIAGNOSIS — J040 Acute laryngitis: Secondary | ICD-10-CM

## 2024-06-13 NOTE — Progress Notes (Signed)
 [place].  I have spent 5 minutes in review of e-visit questionnaire, review and updating patient chart, medical decision making and response to patient.   Margaretann Loveless, PA-C

## 2024-08-18 ENCOUNTER — Ambulatory Visit: Admitting: Nurse Practitioner

## 2024-08-18 ENCOUNTER — Encounter: Payer: Self-pay | Admitting: Nurse Practitioner

## 2024-08-18 VITALS — BP 102/64 | HR 91 | Temp 97.9°F | Resp 14 | Ht 66.5 in | Wt 117.9 lb

## 2024-08-18 DIAGNOSIS — D649 Anemia, unspecified: Secondary | ICD-10-CM | POA: Diagnosis not present

## 2024-08-18 DIAGNOSIS — G7000 Myasthenia gravis without (acute) exacerbation: Secondary | ICD-10-CM

## 2024-08-18 DIAGNOSIS — Z7689 Persons encountering health services in other specified circumstances: Secondary | ICD-10-CM | POA: Diagnosis not present

## 2024-08-18 NOTE — Progress Notes (Signed)
 BP 102/64   Pulse 91   Temp 97.9 F (36.6 C)   Resp 14   Ht 5' 6.5 (1.689 m)   Wt 117 lb 14.4 oz (53.5 kg)   LMP 07/22/2024   SpO2 99%   BMI 18.74 kg/m    Subjective:    Patient ID: Autumn Hubbard, female    DOB: August 06, 2001, 23 y.o.   MRN: [id number]  HPI: Autumn Hubbard is a 23 y.o. female  Chief Complaint  Patient presents with   Establish Care   Anemia    Discussed the use of AI scribe software for clinical note transcription with the patient, who gave verbal consent to proceed.  History of Present Illness Autumn Hubbard is a 23 year old female with myasthenia gravis who presents to establish care.  Neuromuscular symptoms - Diagnosed with myasthenia gravis in 2019 - Initial symptoms included diplopia and frequent falls - Symptoms are well controlled with medication adherence - Symptoms worsen if a dose is missed - History of thymectomy as part of treatment  Hematologic abnormalities - History of anemia - Recent CBC on May 02, 2024: WBC 2.7, hemoglobin 10.8, hematocrit 32.6 - Started iron supplementation one week ago - B12 level low at 557 - Here for re-evaluation of anemia - No blood in stool - No palpitations or shortness of breath  Gynecologic symptoms - Menstrual periods have become lighter with age  Immunization and infectious disease screening - Negative HIV and hepatitis C screenings four months ago - Uncertain of last tetanus vaccination, but believes all childhood vaccinations were received         08/18/2024    9:04 AM  Depression screen PHQ 2/9  Decreased Interest 0  Down, Depressed, Hopeless 0  PHQ - 2 Score 0  Altered sleeping 0  Tired, decreased energy 0  Change in appetite 0  Feeling bad or failure about yourself  0  Trouble concentrating 0  Moving slowly or fidgety/restless 0  Suicidal thoughts 0  PHQ-9 Score 0  Difficult doing work/chores Not difficult at all    Relevant past medical, surgical, family and social history  reviewed and updated as indicated. Interim medical history since our last visit reviewed. Allergies and medications reviewed and updated.  Review of Systems  Constitutional: Negative for fever or weight change.  Respiratory: Negative for cough and shortness of breath.   Cardiovascular: Negative for chest pain or palpitations.  Gastrointestinal: Negative for abdominal pain, no bowel changes.  Musculoskeletal: Negative for gait problem or joint swelling.  Skin: Negative for rash.  Neurological: Negative for dizziness or headache.  No other specific complaints in a complete review of systems (except as listed in HPI above).      Objective:     BP 102/64   Pulse 91   Temp 97.9 F (36.6 C)   Resp 14   Ht 5' 6.5 (1.689 m)   Wt 117 lb 14.4 oz (53.5 kg)   LMP 07/22/2024   SpO2 99%   BMI 18.74 kg/m    Wt Readings from Last 3 Encounters:  08/18/24 117 lb 14.4 oz (53.5 kg)  11/17/23 110 lb (49.9 kg)  03/14/23 114 lb (51.7 kg)    Physical Exam Physical Exam GENERAL: Alert, cooperative, well developed, no acute distress. HEENT: Normocephalic, normal oropharynx, moist mucous membranes. CHEST: Clear to auscultation bilaterally, no wheezes, rhonchi, or crackles. CARDIOVASCULAR: Normal heart rate and rhythm, S1 and S2 normal without murmurs. ABDOMEN: Soft, non-tender, non-distended, without organomegaly, normal bowel sounds. EXTREMITIES:  No cyanosis or edema. NEUROLOGICAL: Cranial nerves grossly intact, moves all extremities without gross motor or sensory deficit.   Results for orders placed or performed during the hospital encounter of 11/17/23  POCT urinalysis dipstick   Collection Time: 11/17/23 10:38 AM  Result Value Ref Range   Color, UA yellow yellow   Clarity, UA cloudy (A) clear   Glucose, UA negative negative mg/dL   Bilirubin, UA negative negative   Ketones, POC UA negative negative mg/dL   Spec Grav, UA >=8.969 (A) 1.010 - 1.025   Blood, UA large (A) negative   pH,  UA 6.0 5.0 - 8.0   Protein Ur, POC =100 (A) negative mg/dL   Urobilinogen, UA 0.2 0.2 or 1.0 E.U./dL   Nitrite, UA Positive (A) Negative   Leukocytes, UA Trace (A) Negative  POCT urine pregnancy   Collection Time: 11/17/23 10:38 AM  Result Value Ref Range   Preg Test, Ur Negative Negative  Urine Culture   Collection Time: 11/17/23 10:46 AM   Specimen: Urine, Clean Catch  Result Value Ref Range   Specimen Description URINE, CLEAN CATCH    Special Requests      NONE Performed at [facility], [address], [place], [place] [address]    Culture >=100,000 COLONIES/mL ESCHERICHIA COLI (A)    Report Status 11/19/2023 FINAL    Organism ID, Bacteria ESCHERICHIA COLI (A)       Susceptibility   Escherichia coli - MIC*    AMPICILLIN <=2 SENSITIVE Sensitive     CEFAZOLIN <=4 SENSITIVE Sensitive     CEFEPIME <=0.12 SENSITIVE Sensitive     CEFTRIAXONE <=0.25 SENSITIVE Sensitive     CIPROFLOXACIN  <=0.25 SENSITIVE Sensitive     GENTAMICIN <=1 SENSITIVE Sensitive     IMIPENEM <=0.25 SENSITIVE Sensitive     NITROFURANTOIN  <=16 SENSITIVE Sensitive     TRIMETH/SULFA <=20 SENSITIVE Sensitive     AMPICILLIN/SULBACTAM <=2 SENSITIVE Sensitive     PIP/TAZO <=4 SENSITIVE Sensitive ug/mL    * >=100,000 COLONIES/mL ESCHERICHIA COLI          Assessment & Plan:   Problem List Items Addressed This Visit       Nervous and Auditory   Myasthenia gravis (HCC) - Primary   Other Visit Diagnoses       Anemia, unspecified type       Relevant Medications   ferrous sulfate 325 (65 FE) MG EC tablet   Other Relevant Orders   CBC with Differential/Platelet   Iron, TIBC and Ferritin Panel     Encounter to establish care            Assessment and Plan Assessment & Plan Anemia and leukopenia evaluation Recent CBC on May 02, 2024, showed leukopenia with a white blood cell count of 2.7 and anemia with hemoglobin at 10.8 and hematocrit at 32.6. B12 was low at 557. Currently taking iron  supplements as recommended by neurology. - Order repeat CBC to evaluate current blood counts - Check iron levels - If leukopenia persists, refer to hematology  Myasthenia gravis (stable, managed by neurology) Diagnosed in 2019 with symptoms of diplopia and frequent falls. Symptoms are well-regulated with pyridostigmine  60 mg four times a day. Managed by neurology with follow-ups two to three times a year.        Follow up plan: Return in about 1 year (around 08/18/2025) for cpe.

## 2024-08-19 ENCOUNTER — Ambulatory Visit: Payer: Self-pay | Admitting: Nurse Practitioner

## 2024-08-19 DIAGNOSIS — D709 Neutropenia, unspecified: Secondary | ICD-10-CM

## 2024-08-19 DIAGNOSIS — D509 Iron deficiency anemia, unspecified: Secondary | ICD-10-CM

## 2024-08-19 LAB — CBC WITH DIFFERENTIAL/PLATELET
Absolute Lymphocytes: 1153 {cells}/uL (ref 850–3900)
Absolute Monocytes: 473 {cells}/uL (ref 200–950)
Basophils Absolute: 19 {cells}/uL (ref 0–200)
Basophils Relative: 0.7 %
Eosinophils Absolute: 70 {cells}/uL (ref 15–500)
Eosinophils Relative: 2.6 %
HCT: 37 % (ref 35.0–45.0)
Hemoglobin: 10.9 g/dL — ABNORMAL LOW (ref 11.7–15.5)
MCH: 23 pg — ABNORMAL LOW (ref 27.0–33.0)
MCHC: 29.5 g/dL — ABNORMAL LOW (ref 32.0–36.0)
MCV: 78.2 fL — ABNORMAL LOW (ref 80.0–100.0)
MPV: 10.5 fL (ref 7.5–12.5)
Monocytes Relative: 17.5 %
Neutro Abs: 986 {cells}/uL — ABNORMAL LOW (ref 1500–7800)
Neutrophils Relative %: 36.5 %
Platelets: 395 Thousand/uL (ref 140–400)
RBC: 4.73 Million/uL (ref 3.80–5.10)
RDW: 15.7 % — ABNORMAL HIGH (ref 11.0–15.0)
Total Lymphocyte: 42.7 %
WBC: 2.7 Thousand/uL — ABNORMAL LOW (ref 3.8–10.8)

## 2024-08-19 LAB — IRON,TIBC AND FERRITIN PANEL
%SAT: 80 % — ABNORMAL HIGH (ref 16–45)
Ferritin: 8 ng/mL — ABNORMAL LOW (ref 16–154)
Iron: 412 ug/dL — ABNORMAL HIGH (ref 40–190)
TIBC: 512 ug/dL — ABNORMAL HIGH (ref 250–450)

## 2024-09-05 ENCOUNTER — Inpatient Hospital Stay

## 2024-09-05 ENCOUNTER — Ambulatory Visit: Payer: Self-pay | Admitting: Oncology

## 2024-09-05 ENCOUNTER — Encounter: Payer: Self-pay | Admitting: Oncology

## 2024-09-05 ENCOUNTER — Inpatient Hospital Stay: Attending: Oncology | Admitting: Oncology

## 2024-09-05 VITALS — BP 107/67 | HR 84 | Temp 96.8°F | Resp 18

## 2024-09-05 DIAGNOSIS — Z8051 Family history of malignant neoplasm of kidney: Secondary | ICD-10-CM | POA: Insufficient documentation

## 2024-09-05 DIAGNOSIS — D72819 Decreased white blood cell count, unspecified: Secondary | ICD-10-CM

## 2024-09-05 DIAGNOSIS — Z800 Family history of malignant neoplasm of digestive organs: Secondary | ICD-10-CM | POA: Diagnosis not present

## 2024-09-05 DIAGNOSIS — D500 Iron deficiency anemia secondary to blood loss (chronic): Secondary | ICD-10-CM

## 2024-09-05 DIAGNOSIS — D509 Iron deficiency anemia, unspecified: Secondary | ICD-10-CM | POA: Diagnosis present

## 2024-09-05 LAB — CBC WITH DIFFERENTIAL/PLATELET
Abs Immature Granulocytes: 0 K/uL (ref 0.00–0.07)
Basophils Absolute: 0 K/uL (ref 0.0–0.1)
Basophils Relative: 1 %
Eosinophils Absolute: 0.1 K/uL (ref 0.0–0.5)
Eosinophils Relative: 4 %
HCT: 37.5 % (ref 36.0–46.0)
Hemoglobin: 11.4 g/dL — ABNORMAL LOW (ref 12.0–15.0)
Immature Granulocytes: 0 %
Lymphocytes Relative: 47 %
Lymphs Abs: 1.2 K/uL (ref 0.7–4.0)
MCH: 24.3 pg — ABNORMAL LOW (ref 26.0–34.0)
MCHC: 30.4 g/dL (ref 30.0–36.0)
MCV: 80 fL (ref 80.0–100.0)
Monocytes Absolute: 0.4 K/uL (ref 0.1–1.0)
Monocytes Relative: 13 %
Neutro Abs: 0.9 K/uL — ABNORMAL LOW (ref 1.7–7.7)
Neutrophils Relative %: 35 %
Platelets: 351 K/uL (ref 150–400)
RBC: 4.69 MIL/uL (ref 3.87–5.11)
RDW: 22.8 % — ABNORMAL HIGH (ref 11.5–15.5)
WBC: 2.6 K/uL — ABNORMAL LOW (ref 4.0–10.5)
nRBC: 0 % (ref 0.0–0.2)

## 2024-09-05 LAB — CMP (CANCER CENTER ONLY)
ALT: 10 U/L (ref 0–44)
AST: 15 U/L (ref 15–41)
Albumin: 4.6 g/dL (ref 3.5–5.0)
Alkaline Phosphatase: 50 U/L (ref 38–126)
Anion gap: 9 (ref 5–15)
BUN: 16 mg/dL (ref 6–20)
CO2: 26 mmol/L (ref 22–32)
Calcium: 9.3 mg/dL (ref 8.9–10.3)
Chloride: 102 mmol/L (ref 98–111)
Creatinine: 0.61 mg/dL (ref 0.44–1.00)
GFR, Estimated: >60 mL/min (ref >60)
Glucose, Bld: 69 mg/dL — ABNORMAL LOW (ref 70–99)
Potassium: 3.6 mmol/L (ref 3.5–5.1)
Sodium: 137 mmol/L (ref 135–145)
Total Bilirubin: 0.3 mg/dL (ref 0.0–1.2)
Total Protein: 8.1 g/dL (ref 6.5–8.1)

## 2024-09-05 LAB — VITAMIN B12: Vitamin B-12: 730 pg/mL (ref 180–914)

## 2024-09-05 LAB — TECHNOLOGIST SMEAR REVIEW: Plt Morphology: ADEQUATE

## 2024-09-05 NOTE — Assessment & Plan Note (Signed)
 Check CBC, smear, B12, folate, ANA, multiple myeloma panel, light chain ratio, flow cytometry, B12  Check smear showed occasional ELLIPTOCYTES AND RARE TEAR DROP CELLS Will ask pathologist to review

## 2024-09-05 NOTE — Progress Notes (Signed)
 Hematology/Oncology Consult note [phone] [phone]        REFERRING PROVIDER: Gareth Mliss FALCON, FNP   CHIEF COMPLAINTS/REASON FOR VISIT:  Evaluation of iron deficiency anemia   ASSESSMENT & PLAN:   Iron deficiency anemia Labs are reviewed and discussed with patient. Lab Results  Component Value Date   HGB 11.4 (L) 09/05/2024   TIBC 512 (H) 08/18/2024   IRONPCTSAT 80 (H) 08/18/2024   FERRITIN 8 (L) 08/18/2024    Patient has persistent anemia with iron deficiency despite oral iron supplementation. I discussed about IV Venofer treatments. I discussed about the potential risks including but not limited to allergic reactions/infusion reactions including anaphylactic reactions, diarrhea, phlebitis, high blood pressure, wheezing, SOB, skin rash, weight gain,dark urine, leg swelling, back pain, headache, nausea and fatigue, etc. Patient agrees with the plan.  Plan IV venofer weekly x 4  Patient reports not being sexually active.  No chance of pregnancy.  Leukopenia Check CBC, smear, B12, folate, ANA, multiple myeloma panel, light chain ratio, flow cytometry, B12  Check smear showed occasional ELLIPTOCYTES AND RARE TEAR DROP CELLS Will ask pathologist to review   Orders Placed This Encounter  Procedures   Vitamin B12    Standing Status:   Future    Number of Occurrences:   1    Expected Date:   09/05/2024    Expiration Date:   12/04/2024   Flow cytometry panel-leukemia/lymphoma work-up    Standing Status:   Future    Number of Occurrences:   1    Expected Date:   09/05/2024    Expiration Date:   12/04/2024   Kappa/lambda light chains    Standing Status:   Future    Number of Occurrences:   1    Expected Date:   09/05/2024    Expiration Date:   12/04/2024   Multiple Myeloma Panel (SPEP&IFE w/QIG)    Standing Status:   Future    Number of Occurrences:   1    Expected Date:   09/05/2024    Expiration Date:   12/04/2024   ANA, IFA (with reflex)    Standing  Status:   Future    Number of Occurrences:   1    Expected Date:   09/05/2024    Expiration Date:   12/04/2024   CBC with Differential/Platelet    Standing Status:   Future    Number of Occurrences:   1    Expected Date:   09/05/2024    Expiration Date:   12/04/2024   Technologist smear review    Standing Status:   Future    Number of Occurrences:   1    Expected Date:   09/05/2024    Expiration Date:   09/05/2025    Clinical information::   anemia and neutropenia   CMP (Cancer Center only)    Standing Status:   Future    Number of Occurrences:   1    Expected Date:   09/05/2024    Expiration Date:   09/05/2025   Methylmalonic acid, serum    Standing Status:   Future    Number of Occurrences:   1    Expected Date:   09/05/2024    Expiration Date:   09/05/2025   CBC with Differential (Cancer Center Only)    Standing Status:   Future    Expected Date:   11/05/2024    Expiration Date:   02/03/2025   Iron and TIBC    Standing  Status:   Future    Expected Date:   11/05/2024    Expiration Date:   02/03/2025   Ferritin    Standing Status:   Future    Expected Date:   11/05/2024    Expiration Date:   02/03/2025   Retic Panel    Standing Status:   Future    Expected Date:   11/05/2024    Expiration Date:   02/03/2025   Follow-up in 2 months All questions were answered. The patient knows to call the clinic with any problems, questions or concerns.  Zelphia Cap, MD, PhD [facility] Hematology Oncology 09/05/2024   HISTORY OF PRESENTING ILLNESS:   Autumn Hubbard is a  23 y.o.  female with PMH listed below was seen in consultation at the request of  Gareth Mliss FALCON, FNP  for evaluation of iron deficiency anemia as well as leukopenia.  She has been aware of her iron deficiency since 2019. Her menstrual periods are heavy for the first two days, then normalize, lasting about eight days, and occur regularly every 28 to 30 days. No blood in stool or rectal bleeding. She has been taking iron supplements for about a  month without noticing any improvement and reports no side effects. Her most recent hemoglobin level was 10.9 g/dL.  She is concerned about her low white blood cell count, with the most recent count being 2.8 x 10^9/L four weeks ago. No frequent infections, joint pain, rash, or chronic wounds. No cyclic fever or abdominal pain.  She feels fatigued but denies shortness of breath, chest palpitations, headaches, or migraines. She has a good appetite and no dietary restrictions, taking a daily multivitamin and fiber supplement. She drinks alcohol socially, mixed drinks, about twice a month, and is not currently sexually active since March.  She is planning to travel to [place] in November and inquires about the yellow fever vaccine due to her low white blood cell count.    MEDICAL HISTORY:  Past Medical History:  Diagnosis Date   Eczema    Myasthenia gravis (HCC)     SURGICAL HISTORY: Past Surgical History:  Procedure Laterality Date   THYROIDECTOMY  2019    SOCIAL HISTORY: Social History   Socioeconomic History   Marital status: Single    Spouse name: Not on file   Number of children: Not on file   Years of education: Not on file   Highest education level: Not on file  Occupational History   Not on file  Tobacco Use   Smoking status: Never   Smokeless tobacco: Never  Vaping Use   Vaping status: Never Used  Substance and Sexual Activity   Alcohol use: Yes    Comment: socially, twice a month   Drug use: Never   Sexual activity: Not Currently    Birth control/protection: None  Other Topics Concern   Not on file  Social History Narrative   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   She attended [employer].   She lives with both parents. She has one brother.   She enjoys video games, sleeping, and hanging with friends.      She attends [employer]. She is a Medical laboratory scientific officer.   Social Drivers of Health   Financial Resource Strain: Low Risk  (09/05/2024)   Overall Financial  Resource Strain (CARDIA)    Difficulty of Paying Living Expenses: Not very hard  Food Insecurity: No Food Insecurity (09/05/2024)   Hunger Vital Sign    Worried About  Running Out of Food in the Last Year: Never true    Ran Out of Food in the Last Year: Never true  Transportation Needs: No Transportation Needs (08/18/2024)   PRAPARE - Administrator, Civil Service (Medical): No    Lack of Transportation (Non-Medical): No  Physical Activity: Insufficiently Active (08/18/2024)   Exercise Vital Sign    Days of Exercise per Week: 2 days    Minutes of Exercise per Session: 30 min  Stress: No Stress Concern Present (09/05/2024)   [employer] of Occupational Health - Occupational Stress Questionnaire    Feeling of Stress: Not at all  Social Connections: Socially Isolated (08/18/2024)   Social Connection and Isolation Panel    Frequency of Communication with Friends and Family: More than three times a week    Frequency of Social Gatherings with Friends and Family: Twice a week    Attends Religious Services: Never    Database administrator or Organizations: No    Attends Banker Meetings: Never    Marital Status: Never married  Intimate Partner Violence: Unknown (09/05/2024)   Humiliation, Afraid, Rape, and Kick questionnaire    Fear of Current or Ex-Partner: No    Emotionally Abused: No    Physically Abused: No    Sexually Abused: Not on file    FAMILY HISTORY: Family History  Problem Relation Age of Onset   Kidney cancer Mother    Polycystic ovary syndrome Mother    Liver cancer Father    Autoimmune disease Father    Cancer Maternal Grandfather     ALLERGIES:  has no known allergies.  MEDICATIONS:  Current Outpatient Medications  Medication Sig Dispense Refill   ferrous sulfate 325 (65 FE) MG EC tablet Take 325 mg by mouth daily at 6 (six) AM.     pyridostigmine  (MESTINON ) 60 MG tablet TAKE ONE-HALF TABLET BY  MOUTH 4 TIMES DAILY 300 tablet 2   No  current facility-administered medications for this visit.    Review of Systems  Constitutional:  Positive for fatigue. Negative for appetite change, chills and fever.  HENT:   Negative for hearing loss and voice change.   Eyes:  Negative for eye problems.  Respiratory:  Negative for chest tightness, cough and shortness of breath.   Cardiovascular:  Negative for chest pain.  Gastrointestinal:  Negative for abdominal distention, abdominal pain and blood in stool.  Endocrine: Negative for hot flashes.  Genitourinary:  Negative for difficulty urinating and frequency.   Musculoskeletal:  Negative for arthralgias.  Skin:  Negative for itching and rash.  Neurological:  Negative for extremity weakness.  Hematological:  Negative for adenopathy.  Psychiatric/Behavioral:  Negative for confusion.    PHYSICAL EXAMINATION:  Vitals:   09/05/24 1114  BP: 107/67  Pulse: 84  Resp: 18  Temp: (!) 96.8 F (36 C)  SpO2: 100%   There were no vitals filed for this visit.  Physical Exam Constitutional:      General: She is not in acute distress. HENT:     Head: Normocephalic and atraumatic.  Eyes:     General: No scleral icterus. Cardiovascular:     Rate and Rhythm: Normal rate and regular rhythm.     Heart sounds: Normal heart sounds.  Pulmonary:     Effort: Pulmonary effort is normal. No respiratory distress.     Breath sounds: No wheezing.  Abdominal:     General: Bowel sounds are normal. There is no distension.  Palpations: Abdomen is soft.  Musculoskeletal:        General: No deformity. Normal range of motion.     Cervical back: Normal range of motion and neck supple.  Skin:    General: Skin is warm and dry.     Findings: No erythema or rash.  Neurological:     Mental Status: She is alert and oriented to person, place, and time. Mental status is at baseline.  Psychiatric:        Mood and Affect: Mood normal.     LABORATORY DATA:  I have reviewed the data as listed     Latest Ref Rng & Units 09/05/2024   12:03 PM 08/18/2024    9:51 AM 06/28/2021    9:40 AM  CBC  WBC 4.0 - 10.5 K/uL 2.6  2.7  3.5   Hemoglobin 12.0 - 15.0 g/dL 88.5  89.0  87.0   Hematocrit 36.0 - 46.0 % 37.5  37.0  38.1   Platelets 150 - 400 K/uL 351  395  317       Latest Ref Rng & Units 09/05/2024   12:03 PM  CMP  Glucose 70 - 99 mg/dL 69   BUN 6 - 20 mg/dL 16   Creatinine 9.55 - 1.00 mg/dL 9.38   Sodium 864 - 854 mmol/L 137   Potassium 3.5 - 5.1 mmol/L 3.6   Chloride 98 - 111 mmol/L 102   CO2 22 - 32 mmol/L 26   Calcium 8.9 - 10.3 mg/dL 9.3   Total Protein 6.5 - 8.1 g/dL 8.1   Total Bilirubin 0.0 - 1.2 mg/dL 0.3   Alkaline Phos 38 - 126 U/L 50   AST 15 - 41 U/L 15   ALT 0 - 44 U/L 10       RADIOGRAPHIC STUDIES: I have personally reviewed the radiological images as listed and agreed with the findings in the report. No results found.

## 2024-09-05 NOTE — Assessment & Plan Note (Signed)
 Labs are reviewed and discussed with patient. Lab Results  Component Value Date   HGB 11.4 (L) 09/05/2024   TIBC 512 (H) 08/18/2024   IRONPCTSAT 80 (H) 08/18/2024   FERRITIN 8 (L) 08/18/2024    Patient has persistent anemia with iron deficiency despite oral iron supplementation. I discussed about IV Venofer treatments. I discussed about the potential risks including but not limited to allergic reactions/infusion reactions including anaphylactic reactions, diarrhea, phlebitis, high blood pressure, wheezing, SOB, skin rash, weight gain,dark urine, leg swelling, back pain, headache, nausea and fatigue, etc. Patient agrees with the plan.  Plan IV venofer weekly x 4  Patient reports not being sexually active.  No chance of pregnancy.

## 2024-09-06 ENCOUNTER — Other Ambulatory Visit: Payer: Self-pay

## 2024-09-06 DIAGNOSIS — D72819 Decreased white blood cell count, unspecified: Secondary | ICD-10-CM

## 2024-09-06 LAB — PATHOLOGIST SMEAR REVIEW

## 2024-09-06 LAB — KAPPA/LAMBDA LIGHT CHAINS
Kappa free light chain: 12.4 mg/L (ref 3.3–19.4)
Kappa, lambda light chain ratio: 0.85 (ref 0.26–1.65)
Lambda free light chains: 14.6 mg/L (ref 5.7–26.3)

## 2024-09-06 LAB — ANTINUCLEAR ANTIBODIES, IFA: ANA Ab, IFA: NEGATIVE

## 2024-09-06 NOTE — Progress Notes (Signed)
Message sent to lab °

## 2024-09-06 NOTE — Progress Notes (Signed)
 Sample sent to path, per [facility]

## 2024-09-07 LAB — COMP PANEL: LEUKEMIA/LYMPHOMA

## 2024-09-07 LAB — MULTIPLE MYELOMA PANEL, SERUM
Albumin SerPl Elph-Mcnc: 4 g/dL (ref 2.9–4.4)
Albumin/Glob SerPl: 1.3 (ref 0.7–1.7)
Alpha 1: 0.2 g/dL (ref 0.0–0.4)
Alpha2 Glob SerPl Elph-Mcnc: 0.7 g/dL (ref 0.4–1.0)
B-Globulin SerPl Elph-Mcnc: 1.3 g/dL (ref 0.7–1.3)
Gamma Glob SerPl Elph-Mcnc: 1 g/dL (ref 0.4–1.8)
Globulin, Total: 3.2 g/dL (ref 2.2–3.9)
IgA: 318 mg/dL (ref 87–352)
IgG (Immunoglobin G), Serum: 1116 mg/dL (ref 586–1602)
IgM (Immunoglobulin M), Srm: 78 mg/dL (ref 26–217)
Total Protein ELP: 7.2 g/dL (ref 6.0–8.5)

## 2024-09-07 LAB — METHYLMALONIC ACID, SERUM: Methylmalonic Acid, Quantitative: 71 nmol/L (ref 0–378)

## 2024-09-19 ENCOUNTER — Encounter: Payer: Self-pay | Admitting: Oncology

## 2024-09-20 ENCOUNTER — Other Ambulatory Visit: Payer: Self-pay

## 2024-09-20 ENCOUNTER — Inpatient Hospital Stay

## 2024-09-20 VITALS — BP 104/70 | HR 70 | Temp 99.0°F | Resp 16

## 2024-09-20 DIAGNOSIS — D72819 Decreased white blood cell count, unspecified: Secondary | ICD-10-CM

## 2024-09-20 DIAGNOSIS — D500 Iron deficiency anemia secondary to blood loss (chronic): Secondary | ICD-10-CM

## 2024-09-20 DIAGNOSIS — D509 Iron deficiency anemia, unspecified: Secondary | ICD-10-CM | POA: Diagnosis not present

## 2024-09-20 MED ORDER — SODIUM CHLORIDE 0.9% FLUSH
10.0000 mL | Freq: Once | INTRAVENOUS | Status: AC | PRN
  Administered 2024-09-20: 10 mL
  Filled 2024-09-20: qty 10

## 2024-09-20 MED ORDER — IRON SUCROSE 20 MG/ML IV SOLN
200.0000 mg | Freq: Once | INTRAVENOUS | Status: AC
  Administered 2024-09-20: 200 mg via INTRAVENOUS
  Filled 2024-09-20: qty 10

## 2024-09-20 NOTE — Telephone Encounter (Signed)
 Per Dr. Babara please set up lab encounter in early oct, check cbc with diff, ESR CRP, CMV igg and igm, EBV antibody.  Dr. Babara talked to her and pt is aware of plan.

## 2024-09-27 ENCOUNTER — Inpatient Hospital Stay

## 2024-09-27 VITALS — BP 110/76 | HR 77 | Temp 96.0°F | Resp 18

## 2024-09-27 DIAGNOSIS — D500 Iron deficiency anemia secondary to blood loss (chronic): Secondary | ICD-10-CM

## 2024-09-27 DIAGNOSIS — D509 Iron deficiency anemia, unspecified: Secondary | ICD-10-CM | POA: Diagnosis not present

## 2024-09-27 MED ORDER — IRON SUCROSE 20 MG/ML IV SOLN
200.0000 mg | Freq: Once | INTRAVENOUS | Status: AC
  Administered 2024-09-27: 200 mg via INTRAVENOUS
  Filled 2024-09-27: qty 10

## 2024-09-27 NOTE — Patient Instructions (Signed)

## 2024-10-04 ENCOUNTER — Inpatient Hospital Stay: Attending: Oncology

## 2024-10-04 ENCOUNTER — Inpatient Hospital Stay

## 2024-10-04 VITALS — BP 104/70 | HR 74 | Temp 97.3°F | Resp 16

## 2024-10-04 DIAGNOSIS — D509 Iron deficiency anemia, unspecified: Secondary | ICD-10-CM | POA: Insufficient documentation

## 2024-10-04 DIAGNOSIS — D72819 Decreased white blood cell count, unspecified: Secondary | ICD-10-CM

## 2024-10-04 DIAGNOSIS — D500 Iron deficiency anemia secondary to blood loss (chronic): Secondary | ICD-10-CM

## 2024-10-04 LAB — CBC WITH DIFFERENTIAL (CANCER CENTER ONLY)
Abs Immature Granulocytes: 0 K/uL (ref 0.00–0.07)
Basophils Absolute: 0 K/uL (ref 0.0–0.1)
Basophils Relative: 1 %
Eosinophils Absolute: 0.1 K/uL (ref 0.0–0.5)
Eosinophils Relative: 2 %
HCT: 39.2 % (ref 36.0–46.0)
Hemoglobin: 12.5 g/dL (ref 12.0–15.0)
Immature Granulocytes: 0 %
Lymphocytes Relative: 59 %
Lymphs Abs: 1.2 K/uL (ref 0.7–4.0)
MCH: 26 pg (ref 26.0–34.0)
MCHC: 31.9 g/dL (ref 30.0–36.0)
MCV: 81.5 fL (ref 80.0–100.0)
Monocytes Absolute: 0.3 K/uL (ref 0.1–1.0)
Monocytes Relative: 14 %
Neutro Abs: 0.5 K/uL — ABNORMAL LOW (ref 1.7–7.7)
Neutrophils Relative %: 24 %
Platelet Count: 340 K/uL (ref 150–400)
RBC: 4.81 MIL/uL (ref 3.87–5.11)
RDW: 22.5 % — ABNORMAL HIGH (ref 11.5–15.5)
WBC Count: 2.1 K/uL — ABNORMAL LOW (ref 4.0–10.5)
nRBC: 0 % (ref 0.0–0.2)

## 2024-10-04 LAB — SEDIMENTATION RATE: Sed Rate: 11 mm/h (ref 0–20)

## 2024-10-04 LAB — C-REACTIVE PROTEIN: CRP: 0.5 mg/dL (ref <1.0)

## 2024-10-04 MED ORDER — IRON SUCROSE 20 MG/ML IV SOLN
200.0000 mg | Freq: Once | INTRAVENOUS | Status: AC
  Administered 2024-10-04: 200 mg via INTRAVENOUS
  Filled 2024-10-04: qty 10

## 2024-10-04 MED ORDER — SODIUM CHLORIDE 0.9% FLUSH
10.0000 mL | Freq: Once | INTRAVENOUS | Status: AC | PRN
  Administered 2024-10-04: 10 mL
  Filled 2024-10-04: qty 10

## 2024-10-05 LAB — CMV ANTIBODY, IGG (EIA): CMV Ab - IgG: <0.6 U/mL (ref 0.00–0.59)

## 2024-10-05 LAB — CMV IGM: CMV IgM: <30 [AU]/ml (ref 0.0–29.9)

## 2024-10-05 LAB — EPSTEIN-BARR VIRUS (EBV) ANTIBODY PROFILE
EBV NA IgG: 56.8 U/mL — ABNORMAL HIGH (ref 0.0–17.9)
EBV VCA IgG: 169 U/mL — ABNORMAL HIGH (ref 0.0–17.9)
EBV VCA IgM: <36 U/mL (ref 0.0–35.9)

## 2024-10-07 NOTE — Telephone Encounter (Signed)
 Called pt and left detailed VM letting her know that Neutropenia is worse and checking on what dose of Vyvgart she is? Also left message for her to let us  know if she is ok with additional labs and bone marrow bx.

## 2024-10-10 NOTE — Telephone Encounter (Signed)
 Call made to pt, no answer. Detailed VM left.

## 2024-10-11 ENCOUNTER — Inpatient Hospital Stay

## 2024-10-11 VITALS — BP 105/73 | HR 79 | Temp 96.6°F | Resp 16

## 2024-10-11 DIAGNOSIS — D509 Iron deficiency anemia, unspecified: Secondary | ICD-10-CM | POA: Diagnosis not present

## 2024-10-11 DIAGNOSIS — D500 Iron deficiency anemia secondary to blood loss (chronic): Secondary | ICD-10-CM

## 2024-10-11 MED ORDER — IRON SUCROSE 20 MG/ML IV SOLN
200.0000 mg | Freq: Once | INTRAVENOUS | Status: AC
  Administered 2024-10-11: 200 mg via INTRAVENOUS

## 2024-10-11 MED ORDER — SODIUM CHLORIDE 0.9% FLUSH
10.0000 mL | Freq: Once | INTRAVENOUS | Status: DC | PRN
  Filled 2024-10-11: qty 10

## 2024-11-11 ENCOUNTER — Inpatient Hospital Stay: Attending: Oncology

## 2024-11-11 DIAGNOSIS — Z8051 Family history of malignant neoplasm of kidney: Secondary | ICD-10-CM | POA: Insufficient documentation

## 2024-11-11 DIAGNOSIS — D709 Neutropenia, unspecified: Secondary | ICD-10-CM | POA: Insufficient documentation

## 2024-11-11 DIAGNOSIS — D500 Iron deficiency anemia secondary to blood loss (chronic): Secondary | ICD-10-CM

## 2024-11-11 DIAGNOSIS — Z800 Family history of malignant neoplasm of digestive organs: Secondary | ICD-10-CM | POA: Insufficient documentation

## 2024-11-11 DIAGNOSIS — D509 Iron deficiency anemia, unspecified: Secondary | ICD-10-CM | POA: Diagnosis present

## 2024-11-11 DIAGNOSIS — N920 Excessive and frequent menstruation with regular cycle: Secondary | ICD-10-CM | POA: Insufficient documentation

## 2024-11-11 LAB — IRON AND TIBC
Iron: 58 ug/dL (ref 28–170)
Saturation Ratios: 16 % (ref 10.4–31.8)
TIBC: 361 ug/dL (ref 250–450)
UIBC: 304 ug/dL

## 2024-11-11 LAB — RETIC PANEL
Immature Retic Fract: 12.1 % (ref 2.3–15.9)
RBC.: 4.77 MIL/uL (ref 3.87–5.11)
Retic Count, Absolute: 43.9 K/uL (ref 19.0–186.0)
Retic Ct Pct: 0.9 % (ref 0.4–3.1)
Reticulocyte Hemoglobin: 34 pg (ref >27.9)

## 2024-11-11 LAB — CBC WITH DIFFERENTIAL (CANCER CENTER ONLY)
Abs Immature Granulocytes: 0.21 K/uL — ABNORMAL HIGH (ref 0.00–0.07)
Basophils Absolute: 0.1 K/uL (ref 0.0–0.1)
Basophils Relative: 2 %
Eosinophils Absolute: 0.1 K/uL (ref 0.0–0.5)
Eosinophils Relative: 4 %
HCT: 40.9 % (ref 36.0–46.0)
Hemoglobin: 13.2 g/dL (ref 12.0–15.0)
Immature Granulocytes: 7 %
Lymphocytes Relative: 38 %
Lymphs Abs: 1.3 K/uL (ref 0.7–4.0)
MCH: 28.2 pg (ref 26.0–34.0)
MCHC: 32.3 g/dL (ref 30.0–36.0)
MCV: 87.4 fL (ref 80.0–100.0)
Monocytes Absolute: 0.3 K/uL (ref 0.1–1.0)
Monocytes Relative: 10 %
Neutro Abs: 1.2 K/uL — ABNORMAL LOW (ref 1.7–7.7)
Neutrophils Relative %: 39 %
Platelet Count: 328 K/uL (ref 150–400)
RBC: 4.68 MIL/uL (ref 3.87–5.11)
RDW: 19.7 % — ABNORMAL HIGH (ref 11.5–15.5)
Smear Review: NORMAL
WBC Count: 3.2 K/uL — ABNORMAL LOW (ref 4.0–10.5)
nRBC: 0 % (ref 0.0–0.2)

## 2024-11-11 LAB — FERRITIN: Ferritin: 205 ng/mL (ref 11–307)

## 2024-11-15 ENCOUNTER — Inpatient Hospital Stay

## 2024-11-15 ENCOUNTER — Encounter: Payer: Self-pay | Admitting: Oncology

## 2024-11-15 ENCOUNTER — Inpatient Hospital Stay: Admitting: Oncology

## 2024-11-15 VITALS — BP 107/78 | HR 77 | Temp 97.6°F | Resp 20 | Wt 113.1 lb

## 2024-11-15 DIAGNOSIS — D500 Iron deficiency anemia secondary to blood loss (chronic): Secondary | ICD-10-CM

## 2024-11-15 DIAGNOSIS — D509 Iron deficiency anemia, unspecified: Secondary | ICD-10-CM | POA: Diagnosis not present

## 2024-11-15 DIAGNOSIS — D72819 Decreased white blood cell count, unspecified: Secondary | ICD-10-CM | POA: Diagnosis not present

## 2024-11-15 NOTE — Progress Notes (Signed)
 Hematology/Oncology Consult note [phone] [phone]        REFERRING PROVIDER: Gareth Mliss FALCON, FNP   CHIEF COMPLAINTS/REASON FOR VISIT:  Neutropenia, iron  deficiency anemia   ASSESSMENT & PLAN:   Iron  deficiency anemia Labs are reviewed and discussed with patient. Lab Results  Component Value Date   HGB 13.2 11/11/2024   TIBC 361 11/11/2024   IRONPCTSAT 16 11/11/2024   FERRITIN 205 11/11/2024   Labs reviewed and discussed with patient.  Normalized hemoglobin and iron  level.  No need for Venofer  treatments.  Leukopenia Previous workup showed negative M protein on protein electrophoresis, normal light chain levels negative peripheral blood flow cytometry.  Normal B12, ESR and CRP.  Negative EBV IgM negative ANA Pathology smear showed normal WBC RBC platelet morphologies. Chronic leukopenia with neutropenia.  ANC has improved to 1.2.  ? ethnic neutropenia.  Versus other etiologies.  Recommend observation and repeat labs in 6 months.   Orders Placed This Encounter  Procedures   Iron  and TIBC    Standing Status:   Future    Expected Date:   05/15/2025    Expiration Date:   08/13/2025   Ferritin    Standing Status:   Future    Expected Date:   05/15/2025    Expiration Date:   08/13/2025   Follow-up in 6 months All questions were answered. The patient knows to call the clinic with any problems, questions or concerns.  Autumn Cap, MD, PhD [facility] Hematology Oncology 11/15/2024   HISTORY OF PRESENTING ILLNESS:   Autumn Hubbard is a  23 y.o.  female with PMH listed below was seen in consultation at the request of  Gareth Mliss FALCON, FNP  for evaluation of iron  deficiency anemia as well as leukopenia.  She has been aware of her iron  deficiency since 2019. Her menstrual periods are heavy for the first two days, then normalize, lasting about eight days, and occur regularly every 28 to 30 days. No blood in stool or rectal bleeding. She has been taking  iron  supplements for about a month without noticing any improvement and reports no side effects. Her most recent hemoglobin level was 10.9 g/dL.  She is concerned about her low white blood cell count, with the most recent count being 2.8 x 10^9/L four weeks ago. No frequent infections, joint pain, rash, or chronic wounds. No cyclic fever or abdominal pain.  She feels fatigued but denies shortness of breath, chest palpitations, headaches, or migraines. She has a good appetite and no dietary restrictions, taking a daily multivitamin and fiber supplement. She drinks alcohol socially, mixed drinks, about twice a month, and is not currently sexually active since March.  She is planning to travel to [place] in November and inquires about the yellow fever vaccine due to her low white blood cell count.   INTERVAL HISTORY Autumn Hubbard is a 23 y.o. female who has above history reviewed by me today presents for follow up visit for iron  deficiency anemia and neutropenia.  Blood work at the last visit showed ANC of 0.5.  Multiple attempts to call patient without able to reach her. Repeat blood work on 11/11/2024 showed improvement of leukopenia with ANC of 1.2.  Patient denies any recent infection.  Today she reports feeling well.  No new complaints.  MEDICAL HISTORY:  Past Medical History:  Diagnosis Date   Eczema    Myasthenia gravis (HCC)     SURGICAL HISTORY: Past Surgical History:  Procedure Laterality Date   THYROIDECTOMY  2019    SOCIAL HISTORY: Social History   Socioeconomic History   Marital status: Single    Spouse name: Not on file   Number of children: Not on file   Years of education: Not on file   Highest education level: Not on file  Occupational History   Not on file  Tobacco Use   Smoking status: Never   Smokeless tobacco: Never  Vaping Use   Vaping status: Never Used  Substance and Sexual Activity   Alcohol use: Yes    Comment: socially, twice a month   Drug use:  Never   Sexual activity: Not Currently    Birth control/protection: None  Other Topics Concern   Not on file  Social History Narrative   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   She attended [employer].   She lives with both parents. She has one brother.   She enjoys video games, sleeping, and hanging with friends.      She attends [employer]. She is a medical laboratory scientific officer.   Social Drivers of Corporate Investment Banker Strain: Low Risk  (09/05/2024)   Overall Financial Resource Strain (CARDIA)    Difficulty of Paying Living Expenses: Not very hard  Food Insecurity: No Food Insecurity (09/05/2024)   Hunger Vital Sign    Worried About Running Out of Food in the Last Year: Never true    Ran Out of Food in the Last Year: Never true  Transportation Needs: No Transportation Needs (08/18/2024)   PRAPARE - Administrator, Civil Service (Medical): No    Lack of Transportation (Non-Medical): No  Physical Activity: Insufficiently Active (08/18/2024)   Exercise Vital Sign    Days of Exercise per Week: 2 days    Minutes of Exercise per Session: 30 min  Stress: No Stress Concern Present (09/05/2024)   [employer] of Occupational Health - Occupational Stress Questionnaire    Feeling of Stress: Not at all  Social Connections: Socially Isolated (08/18/2024)   Social Connection and Isolation Panel    Frequency of Communication with Friends and Family: More than three times a week    Frequency of Social Gatherings with Friends and Family: Twice a week    Attends Religious Services: Never    Database Administrator or Organizations: No    Attends Banker Meetings: Never    Marital Status: Never married  Intimate Partner Violence: Unknown (09/05/2024)   Humiliation, Afraid, Rape, and Kick questionnaire    Fear of Current or Ex-Partner: No    Emotionally Abused: No    Physically Abused: No    Sexually Abused: Not on file    FAMILY HISTORY: Family History  Problem Relation Age  of Onset   Kidney cancer Mother    Polycystic ovary syndrome Mother    Liver cancer Father    Autoimmune disease Father    Cancer Maternal Grandfather     ALLERGIES:  has no known allergies.  MEDICATIONS:  Current Outpatient Medications  Medication Sig Dispense Refill   ferrous sulfate 325 (65 FE) MG EC tablet Take 325 mg by mouth daily at 6 (six) AM.     pyridostigmine  (MESTINON ) 60 MG tablet TAKE ONE-HALF TABLET BY  MOUTH 4 TIMES DAILY 300 tablet 2   No current facility-administered medications for this visit.    Review of Systems  Constitutional:  Positive for fatigue. Negative for appetite change, chills and fever.  HENT:   Negative for hearing loss and voice  change.   Eyes:  Negative for eye problems.  Respiratory:  Negative for chest tightness, cough and shortness of breath.   Cardiovascular:  Negative for chest pain.  Gastrointestinal:  Negative for abdominal distention, abdominal pain and blood in stool.  Endocrine: Negative for hot flashes.  Genitourinary:  Negative for difficulty urinating and frequency.   Musculoskeletal:  Negative for arthralgias.  Skin:  Negative for itching and rash.  Neurological:  Negative for extremity weakness.  Hematological:  Negative for adenopathy.  Psychiatric/Behavioral:  Negative for confusion.    PHYSICAL EXAMINATION:  Vitals:   11/15/24 1010  BP: 107/78  Pulse: 77  Resp: 20  Temp: 97.6 F (36.4 C)  SpO2: 100%   Filed Weights   11/15/24 1010  Weight: 113 lb 1.6 oz (51.3 kg)    Physical Exam Constitutional:      General: She is not in acute distress. HENT:     Head: Normocephalic and atraumatic.  Eyes:     General: No scleral icterus. Cardiovascular:     Rate and Rhythm: Normal rate.  Pulmonary:     Effort: Pulmonary effort is normal. No respiratory distress.     Breath sounds: No wheezing.  Abdominal:     General: Bowel sounds are normal. There is no distension.     Palpations: Abdomen is soft.   Musculoskeletal:        General: Normal range of motion.     Cervical back: Normal range of motion and neck supple.  Skin:    Findings: No erythema or rash.  Neurological:     Mental Status: She is alert and oriented to person, place, and time. Mental status is at baseline.  Psychiatric:        Mood and Affect: Mood normal.     LABORATORY DATA:  I have reviewed the data as listed    Latest Ref Rng & Units 11/11/2024   11:19 AM 10/04/2024    3:07 PM 09/05/2024   12:03 PM  CBC  WBC 4.0 - 10.5 K/uL 3.2  2.1  2.6   Hemoglobin 12.0 - 15.0 g/dL 86.7  87.4  88.5   Hematocrit 36.0 - 46.0 % 40.9  39.2  37.5   Platelets 150 - 400 K/uL 328  340  351       Latest Ref Rng & Units 09/05/2024   12:03 PM  CMP  Glucose 70 - 99 mg/dL 69   BUN 6 - 20 mg/dL 16   Creatinine 9.55 - 1.00 mg/dL 9.38   Sodium 864 - 854 mmol/L 137   Potassium 3.5 - 5.1 mmol/L 3.6   Chloride 98 - 111 mmol/L 102   CO2 22 - 32 mmol/L 26   Calcium 8.9 - 10.3 mg/dL 9.3   Total Protein 6.5 - 8.1 g/dL 8.1   Total Bilirubin 0.0 - 1.2 mg/dL 0.3   Alkaline Phos 38 - 126 U/L 50   AST 15 - 41 U/L 15   ALT 0 - 44 U/L 10       RADIOGRAPHIC STUDIES: I have personally reviewed the radiological images as listed and agreed with the findings in the report. No results found.

## 2024-11-15 NOTE — Assessment & Plan Note (Addendum)
 Labs are reviewed and discussed with patient. Lab Results  Component Value Date   HGB 13.2 11/11/2024   TIBC 361 11/11/2024   IRONPCTSAT 16 11/11/2024   FERRITIN 205 11/11/2024   Labs reviewed and discussed with patient.  Normalized hemoglobin and iron  level.  No need for Venofer  treatments.

## 2024-11-15 NOTE — Assessment & Plan Note (Addendum)
 Previous workup showed negative M protein on protein electrophoresis, normal light chain levels negative peripheral blood flow cytometry.  Normal B12, ESR and CRP.  Negative EBV IgM, negative CMV IgM, negative ANA Pathology smear showed normal WBC RBC platelet morphologies. Chronic leukopenia with neutropenia.  ANC has improved to 1.2.  ? ethnic neutropenia.  Versus other etiologies.  Recommend observation and repeat labs in 6 months.

## 2025-05-15 ENCOUNTER — Inpatient Hospital Stay

## 2025-05-18 ENCOUNTER — Inpatient Hospital Stay: Admitting: Oncology

## 2025-05-18 ENCOUNTER — Inpatient Hospital Stay
